# Patient Record
Sex: Female | Born: 1970 | Race: White | Hispanic: No | Marital: Married | Smoking: Heavy tobacco smoker
Health system: Southern US, Community
[De-identification: ages and names within clinical notes are randomized; demographics above are authoritative.]

## PROBLEM LIST (undated history)

## (undated) DIAGNOSIS — E063 Autoimmune thyroiditis: Secondary | ICD-10-CM

## (undated) DIAGNOSIS — E038 Other specified hypothyroidism: Secondary | ICD-10-CM

## (undated) DIAGNOSIS — E039 Hypothyroidism, unspecified: Secondary | ICD-10-CM

## (undated) HISTORY — PX: OTHER SURGICAL HISTORY: SHX169

## (undated) HISTORY — PX: WISDOM TOOTH EXTRACTION: SHX21

---

## 1998-12-26 HISTORY — PX: TUBAL LIGATION: SHX77

## 2016-07-05 ENCOUNTER — Ambulatory Visit (INDEPENDENT_AMBULATORY_CARE_PROVIDER_SITE_OTHER): Payer: Managed Care, Other (non HMO) | Admitting: Internal Medicine

## 2016-07-05 ENCOUNTER — Encounter: Payer: Self-pay | Admitting: Internal Medicine

## 2016-07-05 VITALS — BP 122/82 | HR 82 | Ht 67.0 in | Wt 212.0 lb

## 2016-07-05 DIAGNOSIS — E038 Other specified hypothyroidism: Secondary | ICD-10-CM

## 2016-07-05 DIAGNOSIS — E041 Nontoxic single thyroid nodule: Secondary | ICD-10-CM

## 2016-07-05 DIAGNOSIS — E063 Autoimmune thyroiditis: Secondary | ICD-10-CM

## 2016-07-05 DIAGNOSIS — E890 Postprocedural hypothyroidism: Secondary | ICD-10-CM | POA: Insufficient documentation

## 2016-07-05 LAB — TSH: TSH: 2.49 u[IU]/mL (ref 0.35–4.50)

## 2016-07-05 LAB — T3, FREE: T3 FREE: 2.5 pg/mL (ref 2.3–4.2)

## 2016-07-05 LAB — T4, FREE: FREE T4: 0.96 ng/dL (ref 0.60–1.60)

## 2016-07-05 MED ORDER — ARMOUR THYROID 60 MG PO TABS: 60.0000 mg | ORAL_TABLET | Freq: Every day | ORAL | Status: DC

## 2016-07-05 NOTE — Patient Instructions (Signed)
For now, continue Synthroid 100 mcg daily.  Take the thyroid hormone every day, with water, at least 30 minutes before breakfast, separated by at least 4 hours from: - acid reflux medications - calcium - iron - multivitamins  Please stop at the lab.  Please come back for a follow-up appointment in 3 months.

## 2016-07-05 NOTE — Progress Notes (Addendum)
Patient ID: Kimberly Pennington, female   DOB: 06/03/1971, 45 y.o.   MRN: [id number]   HPI  Kimberly Pennington is a 45 y.o.-year-old female, referred by her PCP, Dr. Donnajean Lopes, for management of Hashimoto's hypothyroidism and h/o thyroid nodules.  Pt. has been dx with hypothyroidism in ~2008 (she also had goiter) >> 2008: high thyroid Abx, TSH 112 >> started on Synthroid 100 mcg daily since 2015.  She had a thyroid U/S then >> L thyroid nodule (complex) >> Bx: benign. She had serial U/S's since then >> last U/S: 2014 - no change in size of the nodule.  She takes the thyroid hormone: - fasting at 6 am - with water - separated by >1h min from b'fast  - + calcium, multivitamins at 10 am - no iron - no PPIs  I reviewed pt's thyroid tests: 05/04/2016: TSH 2.04 2008: TSH 112   Pt describes: - + weight gain (+ 38 lbs in 2 years). She lost a lot of weight before on a high protein diet. - + fatigue - no cold intolerance - no depression - + constipation - no dry skin - hair loss  Pt denies feeling nodules in neck, dysphagia/odynophagia, SOB with lying down.  She has + FH of thyroid disorders in: sister. No FH of thyroid cancer.  No h/o radiation tx to head or neck. No recent use of iodine supplements.  ROS: Constitutional: See history of present illness Eyes: no blurry vision, no xerophthalmia ENT: no sore throat, no nodules palpated in throat, no dysphagia/odynophagia, + hoarseness Cardiovascular: no CP/SOB/palpitations/leg swelling Respiratory: no cough/SOB Gastrointestinal: no N/V/D/+ C Musculoskeletal: no muscle/+ joint aches Skin: no rashes, + itching Neurological: no tremors/numbness/tingling/dizziness Psychiatric: no depression/anxiety  PMH: - Hypothyroidism due to Hashimoto thyroiditis - Left thyroid nodule  Past Surgical History  Procedure Laterality Date  . Tubal ligation  2000    Social History   Social History  . Marital Status: Married    Spouse Name: N/A  .  Number of Children: 3   Occupational History  . Customer service rep    Social History Main Topics  . Smoking status: Yes, Vape  . Alcohol Use: Beer or wine seldom   . Drug Use: No   Current Outpatient Rx  Name  Route  Sig  Dispense  Refill  . Ergocalciferol (VITAMIN D2) 2000 units TABS   Oral   Take by mouth.         . Multiple Vitamin (MULTI-VITAMIN DAILY PO)   Oral   Take by mouth.         . SYNTHROID 100 MCG tablet      take 1 tablet by mouth daily      1     Dispense as written.    No Known Allergies   Family History  Problem Relation Age of Onset  . Heart disease Father   . Hyperlipidemia Father   . Hypertension Father   + see HPI  PE: BP 122/82 mmHg  Pulse 82  Ht 5\' 7"  (1.702 m)  Wt 212 lb (96.163 kg)  BMI 33.20 kg/m2  SpO2 97%  LMP 07/04/2016 Wt Readings from Last 3 Encounters:  07/05/16 212 lb (96.163 kg)   Constitutional: overweight, in NAD Eyes: PERRLA, EOMI, no exophthalmos ENT: moist mucous membranes, no thyromegaly, no cervical lymphadenopathy Cardiovascular: RRR, No MRG Respiratory: CTA B Gastrointestinal: abdomen soft, NT, ND, BS+ Musculoskeletal: no deformities, strength intact in all 4 Skin: moist, warm, no rashes Neurological: no tremor with outstretched  hands, DTR normal in all 4  ASSESSMENT: 1. Hypothyroidism 2/2 Hashimoto's thyroiditis  2. LThyroid nodule  PLAN:  1. Patient with long-standing hypothyroidism, on levothyroxine therapy. She complains of weight gain and fatigue on her current regimen of brand name Synthroid 100 g daily. Her most recent TSH was normal 2 months ago.  - she does not appear to have a goiter, thyroid nodules, or neck compression symptoms - We discussed about correct intake of levothyroxine, fasting, with water, separated by at least 30 minutes from breakfast, and separated by more than 4 hours from calcium, iron, multivitamins, acid reflux medications (PPIs). She is taking it correctly. - She is  interested in Armour thyroid as an alternative to her levothyroxine. Her sister has tried it and locked it. She is wondering whether this is a good fit for her. - We discussed about positive and negative aspects of using Armour thyroid. I underlined the fact that this is more expensive + Armour is purified from porcine thyroid glands, which is not without risk for contaminants  Also, the ratio between T4 and T3 in Armour is physiologic for pigs, not for humans.  The short half life of T3 can cause fluctuations in blood levels, which can result in mood swings and heart rhythm abnormalities.  The concentration of the active substances (T4 and T3) can be expected to vary between different Armour lots, which can cause variation in the thyroid function tests.  - She would like to go ahead and try it, so we decided to check thyroid tests today: TSH, free T4, free T3, TPO and ATA antibodies. I will then calculated the necessary dose of Armour based on the results. - If labs today are abnormal, she will need to return in 6  weeks for repeat labs - Otherwise, I will see her back in 4 months  2. L thyroid nodule  - no neck compression sxs - benign Bx - I plan to repeat an U/S next year - followed in the past with serial U/S's with no growth  Philemon Kingdom, MD PhD [facility] Endocrinology  Office Visit on 07/05/2016  Component Date Value Ref Range Status  . Free T4 07/05/2016 0.96  0.60 - 1.60 ng/dL Final  . T3, Free 07/05/2016 2.5  2.3 - 4.2 pg/mL Final  . TSH 07/05/2016 2.49  0.35 - 4.50 uIU/mL Final  . Thyroperoxidase Ab SerPl-aCnc 07/05/2016 368* <9 IU/mL Final  . Thyroglobulin Ab 07/05/2016 3* <2 IU/mL Final  Ab's are elevated, confirming Hashimoto's thyroiditis.  TFTs normal on 100 mcg daily of LT4 >> switch to Armour 60 mg daily. Will recheck TFTs in 5-6 weeks.

## 2016-07-06 ENCOUNTER — Telehealth: Payer: Self-pay

## 2016-07-06 LAB — THYROGLOBULIN ANTIBODY: THYROGLOBULIN AB: 3 [IU]/mL — AB (ref <2)

## 2016-07-06 LAB — THYROID PEROXIDASE ANTIBODY: THYROID PEROXIDASE ANTIBODY: 368 [IU]/mL — AB (ref <9)

## 2016-07-06 NOTE — Telephone Encounter (Signed)
Called and spoke with patient about lab results and medication that was sent to pharmacy. Patient had no questions or concerns.

## 2016-10-05 ENCOUNTER — Encounter: Payer: Self-pay | Admitting: Internal Medicine

## 2016-10-05 ENCOUNTER — Ambulatory Visit (INDEPENDENT_AMBULATORY_CARE_PROVIDER_SITE_OTHER): Payer: Managed Care, Other (non HMO) | Admitting: Internal Medicine

## 2016-10-05 VITALS — BP 104/72 | HR 73 | Temp 98.1°F | Resp 14 | Ht 67.0 in | Wt 209.4 lb

## 2016-10-05 DIAGNOSIS — E038 Other specified hypothyroidism: Secondary | ICD-10-CM

## 2016-10-05 DIAGNOSIS — E063 Autoimmune thyroiditis: Secondary | ICD-10-CM

## 2016-10-05 DIAGNOSIS — E041 Nontoxic single thyroid nodule: Secondary | ICD-10-CM

## 2016-10-05 LAB — TSH: TSH: 2.22 u[IU]/mL (ref 0.35–4.50)

## 2016-10-05 LAB — T4, FREE: Free T4: 0.49 ng/dL — ABNORMAL LOW (ref 0.60–1.60)

## 2016-10-05 LAB — T3, FREE: T3 FREE: 4.1 pg/mL (ref 2.3–4.2)

## 2016-10-05 MED ORDER — ARMOUR THYROID 60 MG PO TABS: 60.0000 mg | ORAL_TABLET | Freq: Every day | ORAL | 1 refills | Status: DC

## 2016-10-05 NOTE — Patient Instructions (Signed)
Please stop at the lab.  Please continue Armour 60 mg daily.  Take the thyroid hormone every day, with water, at least 30 minutes before breakfast, separated by at least 4 hours from: - acid reflux medications - calcium - iron - multivitamins  Please send me a message ~ 3-4 weeks before next appt in 6 months to order the new thyroid U/S.  Please come back for a follow-up appointment in 6 months

## 2016-10-05 NOTE — Progress Notes (Signed)
Patient ID: Kimberly Pennington, female   DOB: 1971-08-08, 45 y.o.   MRN: [id number]   HPI  Kimberly Pennington is a 45 y.o.-year-old female, initially referred by her PCP, Dr. Donnajean Lopes, returning for f/u 45 for Hashimoto's hypothyroidism and h/o thyroid nodules. Last visit 3 mo ago.  Pt. has been dx with hypothyroidism in ~2008 (she also had goiter) >> 2008: high thyroid Abx, TSH 112 >> started on Synthroid 100 mcg daily since 2015 >> at last visit, we changed to Armour 60 mg daily. She is still fatigued.   She had a thyroid U/S then >> L thyroid nodule (complex) >> Bx: benign. She had serial U/S's since then >> last U/S: 2014 - no change in size of the nodule.  She takes the thyroid hormone: - fasting at 6 am - with water - separated by >1h from b'fast  - + calcium, multivitamins at 10 am - no iron - no PPIs  I reviewed pt's thyroid tests: Office Visit on 07/05/2016  Component Date Value Ref Range Status  . Free T4 07/05/2016 0.96  0.60 - 1.60 ng/dL Final  . T3, Free 07/05/2016 2.5  2.3 - 4.2 pg/mL Final  . TSH 07/05/2016 2.49  0.35 - 4.50 uIU/mL Final  . Thyroperoxidase Ab SerPl-aCnc 07/06/2016 368* <9 IU/mL Final  . Thyroglobulin Ab 07/06/2016 3* <2 IU/mL Final   05/04/2016: TSH 2.04 2008: TSH 112   Pt describes: - no weight gain (+ 38 lbs in 2 years, then lost 3 lbs).  - + fatigue - no cold intolerance - no depression - + constipation (initialy improved) - no dry skin - no hair loss  Pt denies feeling nodules in neck, dysphagia/odynophagia, SOB with lying down.  She has + FH of thyroid disorders in: sister. No FH of thyroid cancer.  No h/o radiation tx to head or neck. No recent use of iodine supplements.  ROS: Constitutional: See history of present illness Eyes: no blurry vision, no xerophthalmia ENT: no sore throat, no nodules palpated in throat, no dysphagia/odynophagia, no hoarseness Cardiovascular: no CP/SOB/palpitations/leg swelling Respiratory: no  cough/SOB Gastrointestinal: no N/V/D/+ C Musculoskeletal: no muscle/joint aches Skin: no rashes, no itching Neurological: no tremors/numbness/tingling/dizziness  I reviewed pt's medications, allergies, PMH, social hx, family hx, and changes were documented in the history of present illness. Otherwise, unchanged from my initial visit note.  PMH: - Hypothyroidism due to Hashimoto thyroiditis - Left thyroid nodule  Past Surgical History:  Procedure Laterality Date  . TUBAL LIGATION  2000    Social History   Social History  . Marital Status: Married    Spouse Name: N/A  . Number of Children: 3   Occupational History  . Customer service rep    Social History Main Topics  . Smoking status: Yes, Vape  . Alcohol Use: Beer or wine seldom   . Drug Use: No   Current Outpatient Rx  Name  Route  Sig  Dispense  Refill  . Ergocalciferol (VITAMIN D2) 2000 units TABS   Oral   Take by mouth.         . Multiple Vitamin (MULTI-VITAMIN DAILY PO)   Oral   Take by mouth.         . SYNTHROID 100 MCG tablet      take 1 tablet by mouth daily      1     Dispense as written.    No Known Allergies   Family History  Problem Relation Age of Onset  . Heart disease  Father   . Hyperlipidemia Father   . Hypertension Father   + see HPI  PE: BP 104/72   Pulse 73   Temp 98.1 F (36.7 C)   Resp 14   Ht 5\' 7"  (1.702 m)   Wt 209 lb 6.4 oz (95 kg)   SpO2 97%   BMI 32.80 kg/m  Wt Readings from Last 3 Encounters:  10/05/16 209 lb 6.4 oz (95 kg)  07/05/16 212 lb (96.2 kg)   Constitutional: overweight, in NAD Eyes: PERRLA, EOMI, no exophthalmos ENT: moist mucous membranes, no thyromegaly, no cervical lymphadenopathy Cardiovascular: RRR, No MRG Respiratory: CTA B Gastrointestinal: abdomen soft, NT, ND, BS+ Musculoskeletal: no deformities, strength intact in all 4 Skin: moist, warm, no rashes Neurological: no tremor with outstretched hands, DTR normal in all 4  ASSESSMENT: 1.  Hypothyroidism 2/2 Hashimoto's thyroiditis  2. LThyroid nodule  PLAN:  1. Patient with long-standing hypothyroidism, now on on Armour therapy (had weight gain and constipation with LT4). She was interested in Armour thyroid as an alternative to her levothyroxine. Her sister has tried it and locked it. We started this at last visit >> 60 mg daily. She initially felt better, but now still fatigued. - We discussed about correct intake of thyroid hh, fasting, with water, separated by at least 30 minutes from breakfast, and separated by more than 4 hours from calcium, iron, multivitamins, acid reflux medications (PPIs). She is taking it correctly. - will check thyroid tests today: TSH, free T4, free T3 - If labs today are abnormal, she will need to return in 6  weeks for repeat labs - Otherwise, I will see her back in 6 months  2. L thyroid nodule  - no neck compression sxs - benign Bx - I plan to repeat an U/S next year - followed in the past with serial U/S's with no growth  Component     Latest Ref Rng & Units 10/05/2016  T4,Free(Direct)     0.60 - 1.60 ng/dL 0.49 (L)  Triiodothyronine,Free,Serum     2.3 - 4.2 pg/mL 4.1  TSH     0.35 - 4.50 uIU/mL 2.22   Free T3 is at the upper limit of normal, while the TSH is normal. Free T4 is slightly low. For now, I would suggest to continue the current dose of Armour.  Philemon Kingdom, MD PhD [facility] Endocrinology

## 2016-10-06 ENCOUNTER — Other Ambulatory Visit: Payer: Self-pay

## 2016-10-06 DIAGNOSIS — E038 Other specified hypothyroidism: Secondary | ICD-10-CM

## 2016-10-06 DIAGNOSIS — E063 Autoimmune thyroiditis: Principal | ICD-10-CM

## 2016-10-06 MED ORDER — THYROID 90 MG PO TABS: ORAL_TABLET | ORAL | 1 refills | Status: DC

## 2017-04-06 ENCOUNTER — Ambulatory Visit: Payer: Managed Care, Other (non HMO) | Admitting: Internal Medicine

## 2017-04-21 ENCOUNTER — Encounter: Payer: Self-pay | Admitting: Internal Medicine

## 2017-04-21 ENCOUNTER — Ambulatory Visit (INDEPENDENT_AMBULATORY_CARE_PROVIDER_SITE_OTHER): Payer: Managed Care, Other (non HMO) | Admitting: Internal Medicine

## 2017-04-21 VITALS — BP 114/82 | HR 82 | Resp 16 | Ht 68.0 in | Wt 216.0 lb

## 2017-04-21 DIAGNOSIS — E063 Autoimmune thyroiditis: Secondary | ICD-10-CM | POA: Diagnosis not present

## 2017-04-21 DIAGNOSIS — E038 Other specified hypothyroidism: Secondary | ICD-10-CM

## 2017-04-21 DIAGNOSIS — E041 Nontoxic single thyroid nodule: Secondary | ICD-10-CM | POA: Diagnosis not present

## 2017-04-21 LAB — T4, FREE: Free T4: 0.61 ng/dL (ref 0.60–1.60)

## 2017-04-21 LAB — TSH: TSH: 0.66 u[IU]/mL (ref 0.35–4.50)

## 2017-04-21 MED ORDER — THYROID 90 MG PO TABS: ORAL_TABLET | ORAL | 1 refills | Status: DC

## 2017-04-21 NOTE — Progress Notes (Addendum)
Patient ID: Kimberly Pennington, female   DOB: 11-28-1971, 46 y.o.   MRN: [id number]   HPI  Kimberly Pennington is a 46 y.o.-year-old female, initially referred by her PCP, Dr. Donnajean Lopes, returning for f/u for Hashimoto's hypothyroidism and h/o thyroid nodules. Last visit 6 mo ago.  Reviewed hx: Pt. has been dx with hypothyroidism in ~2008 (she also had goiter) >> 2008: high thyroid Abx, TSH 112 >> started on Synthroid 100 mcg daily since 2015 >> we then changed to Armour 60 mg daily.   She had a thyroid U/S then >> R thyroid nodule (complex) - 2 cm >> Bx: benign. She had serial U/S's since then >> last U/S: 2014 - no change in size of the nodule.  Pt is on Armour 60 alternating with 90 mg daily, taken: - in am (6 am) - fasting - at least 30 min from b'fast - no Fe, PPIs - ran out calcium, multivitamins  - not on Biotin  I reviewed pt's thyroid tests: Lab Results  Component Value Date   TSH 2.22 10/05/2016   TSH 2.49 07/05/2016   FREET4 0.49 (L) 10/05/2016   FREET4 0.96 07/05/2016   T3FREE 4.1 10/05/2016   T3FREE 2.5 07/05/2016  05/04/2016: TSH 2.04 2008: TSH 112   Office Visit on 07/05/2016  Component Date Value Ref Range Status  . Thyroperoxidase Ab SerPl-aCnc 07/06/2016 368* <9 IU/mL Final  . Thyroglobulin Ab 07/06/2016 3* <2 IU/mL Final   Pt describes: - no weight gain - + fatigue - no cold intolerance - no depression - no constipation - no dry skin - no hair loss  Pt denies feeling nodules in neck, dysphagia/odynophagia, SOB with lying down.  She has + FH of thyroid disorders in: sister. No FH of thyroid cancer.  No h/o radiation tx to head or neck. No recent use of iodine supplements.  ROS: Constitutional: no weight gain/no weight loss, no fatigue, no subjective hyperthermia, no subjective hypothermia Eyes: no blurry vision, no xerophthalmia ENT: no sore throat, no nodules palpated in throat, no dysphagia, no odynophagia, no hoarseness Cardiovascular: no CP/no SOB/no  palpitations/no leg swelling Respiratory: no cough/no SOB/no wheezing Gastrointestinal: no N/no V/no D/no C/no acid reflux Musculoskeletal: no muscle aches/no joint aches Skin: no rashes, no hair loss Neurological: no tremors/no numbness/no tingling/no dizziness  I reviewed pt's medications, allergies, PMH, social hx, family hx, and changes were documented in the history of present illness. Otherwise, unchanged from my initial visit note.  PMH: - Hypothyroidism due to Hashimoto thyroiditis - Left thyroid nodule  Past Surgical History:  Procedure Laterality Date  . TUBAL LIGATION  2000    Social History   Social History  . Marital Status: Married    Spouse Name: N/A  . Number of Children: 3   Occupational History  . Customer service rep    Social History Main Topics  . Smoking status: Yes, Vape  . Alcohol Use: Beer or wine seldom   . Drug Use: No   Current Outpatient Rx  Name  Route  Sig  Dispense  Refill  . Ergocalciferol (VITAMIN D2) 2000 units TABS   Oral   Take by mouth.         . Multiple Vitamin (MULTI-VITAMIN DAILY PO)   Oral   Take by mouth.         . SYNTHROID 100 MCG tablet      take 1 tablet by mouth daily      1     Dispense as written.  No Known Allergies   Family History  Problem Relation Age of Onset  . Heart disease Father   . Hyperlipidemia Father   . Hypertension Father   + see HPI  PE: BP 114/82   Pulse 82   Resp 16   Ht 5\' 8"  (1.727 m)   Wt 216 lb (98 kg)   SpO2 96%   BMI 32.84 kg/m  Wt Readings from Last 3 Encounters:  04/21/17 216 lb (98 kg)  10/05/16 209 lb 6.4 oz (95 kg)  07/05/16 212 lb (96.2 kg)   Constitutional: obese, in NAD Eyes: PERRLA, EOMI, no exophthalmos ENT: moist mucous membranes, + mild thyroid fullness, no cervical lymphadenopathy Cardiovascular: RRR, No MRG Respiratory: CTA B Gastrointestinal: abdomen soft, NT, ND, BS+ Musculoskeletal: no deformities, strength intact in all 4 Skin: moist,  warm, no rashes Neurological: no tremor with outstretched hands, DTR normal in all 4  ASSESSMENT: 1. Hypothyroidism 2/2 Hashimoto's thyroiditis  2. R Thyroid nodule  PLAN:  1. Patient with long-standing hypothyroidism, on Arrmour thyroid  (had weight gain and constipation with LT4). She still has some fatigue, but is overall feeling well on the current regimen: 60 alternating with 90 mg qod. - We discussed about correct intake of thyroid: every day, with water, at least 30 minutes before breakfast, separated by at least 4 hours from: - acid reflux medications - calcium - iron - multivitamins She is taking it correctly. - will check thyroid tests today:  Orders Placed This Encounter  . T4, free  . TSH   - If labs today are abnormal, she will need to return in 6 weeks for repeat labs - Otherwise, I will see her back in 1 year  2. R thyroid nodule  - no neck compression sxs - previous Bx benign - will check a new U/S now, 4 years from prior - followed with serial U/S in the past >> stable  Component     Latest Ref Rng & Units 04/21/2017  T4,Free(Direct)     0.60 - 1.60 ng/dL 0.61  TSH     0.35 - 4.50 uIU/mL 0.66   TFTs normal.  US SOFT TISSUE HEAD AND NECK  Order: [id number]  Status:  Final result Visible to patient:  No (Not Released) Dx:  Left thyroid nodule  Details   Reading Physician Reading Date Result Priority  Arne Cleveland, MD 04/28/2017   Narrative    CLINICAL DATA: Left nodule on physical exam  EXAM: THYROID ULTRASOUND  TECHNIQUE: Ultrasound examination of the thyroid gland and adjacent soft tissues was performed.  COMPARISON: None.  FINDINGS: Parenchymal Echotexture: Mildly heterogenous  Isthmus: 0.3 cm thickness  Right lobe: 3.7 x 1 x 1.4 cm  Left lobe: 3.1 x 0.7 x 1 cm  _________________________________________________________  Estimated total number of nodules >/= 1 cm: 1  Number of spongiform nodules >/= 2 cm not described below  (TR1): 0  Number of mixed cystic and solid nodules >/= 1.5 cm not described below (TR2): 0  _________________________________________________________  Nodule # 1:  Location: Right; Mid  Maximum size: 1.5 cm; Other 2 dimensions: 0.8 x 1 cm  Composition: solid/almost completely solid (2)  Echogenicity: isoechoic (1)  Shape: not taller-than-wide (0)  Margins: smooth (0)  Echogenic foci: peripheral calcifications (2)  ACR TI-RADS total points: 5.  ACR TI-RADS risk category: TR4 (4-6 points).  ACR TI-RADS recommendations:  **Given size (>/= 1.5 cm) and appearance, fine needle aspiration of this moderately suspicious nodule should be considered based on  TI-RADS criteria.  _________________________________________________________  0.7 x 0.5 cm hypoechoic nodule without calcifications, inferior left.  IMPRESSION: 1. Small heterogeneous thyroid with bilateral nodules. 2. Recommend FNA biopsy of 1.5 cm mid right moderately suspicious nodule.  The above is in keeping with the ACR TI-RADS recommendations - J Am Coll Radiol 2017;14:587-595.   Electronically Signed By: Lucrezia Europe M.D. On: 04/28/2017 14:24       Size of the nodule >> smaller than before, but I do not have access to previous images. Since the nodule does appear slightly worrisome, I would suggest to have another biopsy. If this is benign, no further biopsies are necessary.  Adequacy Reason Satisfactory For Evaluation. Diagnosis THYROID, FINE NEEDLE ASPIRATION, RIGHT RMP (SPECIMEN 1 OF 1, COLLECTED 6/72/09): - FOLLICULAR NEOPLASM OR SUSPICIOUS FOR A FOLLICULAR NEOPLASM (B[place]ESDA CATEGORY IV) - SEE COMMENT DAWN BUTLER MD Pathologist, Electronic Signature (Case signed 05/11/2017) Specimen Clinical Information Nodule 1 Right Mid 1.5 cm; Other 2 dimensions 0.8 x 1cm solid / almost completely solid, Isoechoic, ACR TI-RADS total points: 5, Moderatley suspicious nodule Source Thyroid, Fine Needle  Aspiration, Rt RMP, (Specimen 1 of 1, collected on 05/10/17 )  Risk of malignancy is 15-30% for this results (per Bethesda classification) >> recommendation is for lobectomy.  Philemon Kingdom, MD PhD [facility] Endocrinology

## 2017-04-21 NOTE — Patient Instructions (Addendum)
Please stop at the lab.  Please continue Armour 60 mg alternating with 90 mg every other day.  Take the thyroid hormone every day, with water, at least 30 minutes before breakfast, separated by at least 4 hours from: - acid reflux medications - calcium - iron - multivitamins  You will be called with the schedule for the thyroid U/S.  Please come back for a follow-up appointment in 1 year.

## 2017-04-28 ENCOUNTER — Ambulatory Visit
Admission: RE | Admit: 2017-04-28 | Discharge: 2017-04-28 | Disposition: A | Discharge status: Home / Self Care | Payer: Managed Care, Other (non HMO) | Source: Ambulatory Visit | Attending: Internal Medicine | Admitting: Internal Medicine

## 2017-05-01 ENCOUNTER — Telehealth: Payer: Self-pay | Admitting: Internal Medicine

## 2017-05-01 ENCOUNTER — Other Ambulatory Visit: Payer: Self-pay | Admitting: Internal Medicine

## 2017-05-01 DIAGNOSIS — E041 Nontoxic single thyroid nodule: Secondary | ICD-10-CM

## 2017-05-01 NOTE — Telephone Encounter (Signed)
Done

## 2017-05-01 NOTE — Telephone Encounter (Signed)
Pt called in and wanted to let Dr. Cruzita Lederer know that she would like to go ahead and do the Bx of her Thyroid.

## 2017-05-01 NOTE — Telephone Encounter (Signed)
Wanted you to be aware.  Thank you!   

## 2017-05-10 ENCOUNTER — Other Ambulatory Visit (HOSPITAL_COMMUNITY)
Admission: RE | Admit: 2017-05-10 | Discharge: 2017-05-10 | Disposition: A | Discharge status: Home / Self Care | Payer: Managed Care, Other (non HMO) | Source: Ambulatory Visit | Attending: Radiology | Admitting: Radiology

## 2017-05-10 ENCOUNTER — Ambulatory Visit
Admission: RE | Admit: 2017-05-10 | Discharge: 2017-05-10 | Disposition: A | Discharge status: Home / Self Care | Payer: Managed Care, Other (non HMO) | Source: Ambulatory Visit | Attending: Internal Medicine | Admitting: Internal Medicine

## 2017-05-10 DIAGNOSIS — E041 Nontoxic single thyroid nodule: Secondary | ICD-10-CM | POA: Diagnosis present

## 2017-05-12 ENCOUNTER — Telehealth: Payer: Self-pay | Admitting: Internal Medicine

## 2017-05-12 ENCOUNTER — Other Ambulatory Visit: Payer: Self-pay | Admitting: Internal Medicine

## 2017-05-12 DIAGNOSIS — E041 Nontoxic single thyroid nodule: Secondary | ICD-10-CM

## 2017-05-12 NOTE — Telephone Encounter (Signed)
Please advise. Thank you

## 2017-05-12 NOTE — Telephone Encounter (Signed)
Pt is returning a call regarding her biopsy

## 2017-05-12 NOTE — Telephone Encounter (Signed)
Called back >> no answer. I will retry later.

## 2017-05-24 ENCOUNTER — Ambulatory Visit: Payer: Self-pay | Admitting: Surgery

## 2017-06-13 ENCOUNTER — Ambulatory Visit (HOSPITAL_COMMUNITY)
Admission: RE | Admit: 2017-06-13 | Discharge: 2017-06-13 | Disposition: A | Discharge status: Home / Self Care | Payer: Managed Care, Other (non HMO) | Source: Ambulatory Visit | Attending: Surgery | Admitting: Surgery

## 2017-06-13 ENCOUNTER — Encounter (HOSPITAL_COMMUNITY)
Admission: RE | Admit: 2017-06-13 | Discharge: 2017-06-13 | Disposition: A | Discharge status: Home / Self Care | Payer: Managed Care, Other (non HMO) | Source: Ambulatory Visit | Attending: Surgery | Admitting: Surgery

## 2017-06-13 ENCOUNTER — Encounter (HOSPITAL_COMMUNITY): Payer: Self-pay

## 2017-06-13 DIAGNOSIS — Z01818 Encounter for other preprocedural examination: Secondary | ICD-10-CM

## 2017-06-13 DIAGNOSIS — D440 Neoplasm of uncertain behavior of thyroid gland: Secondary | ICD-10-CM | POA: Diagnosis not present

## 2017-06-13 HISTORY — DX: Autoimmune thyroiditis: E03.8

## 2017-06-13 HISTORY — DX: Hypothyroidism, unspecified: E03.9

## 2017-06-13 HISTORY — DX: Autoimmune thyroiditis: E06.3

## 2017-06-13 LAB — BASIC METABOLIC PANEL
Anion gap: 7 (ref 5–15)
BUN: 7 mg/dL (ref 6–20)
CHLORIDE: 104 mmol/L (ref 101–111)
CO2: 25 mmol/L (ref 22–32)
Calcium: 9.2 mg/dL (ref 8.9–10.3)
Creatinine, Ser: 0.92 mg/dL (ref 0.44–1.00)
GFR calc non Af Amer: >60 mL/min (ref >60)
Glucose, Bld: 99 mg/dL (ref 65–99)
POTASSIUM: 3.7 mmol/L (ref 3.5–5.1)
SODIUM: 136 mmol/L (ref 135–145)

## 2017-06-13 LAB — CBC
HEMATOCRIT: 37.2 % (ref 36.0–46.0)
Hemoglobin: 12.8 g/dL (ref 12.0–15.0)
MCH: 29.2 pg (ref 26.0–34.0)
MCHC: 34.4 g/dL (ref 30.0–36.0)
MCV: 84.9 fL (ref 78.0–100.0)
Platelets: 230 10*3/uL (ref 150–400)
RBC: 4.38 MIL/uL (ref 3.87–5.11)
RDW: 12.4 % (ref 11.5–15.5)
WBC: 7.4 10*3/uL (ref 4.0–10.5)

## 2017-06-13 LAB — HCG, SERUM, QUALITATIVE: Preg, Serum: NEGATIVE

## 2017-06-13 IMAGING — US US SOFT TISSUE HEAD/NECK
1 series · 13 of 25 positions shown · non-contrast
Comparison: None.

CLINICAL DATA: Left nodule on physical exam

EXAM:
THYROID ULTRASOUND
TECHNIQUE: Ultrasound examination of the thyroid gland and adjacent soft
tissues was performed.

[Series 1: us soft tissue head/neck · 0.04mm/px · 13 of 42 slices shown]
[im 1/42]
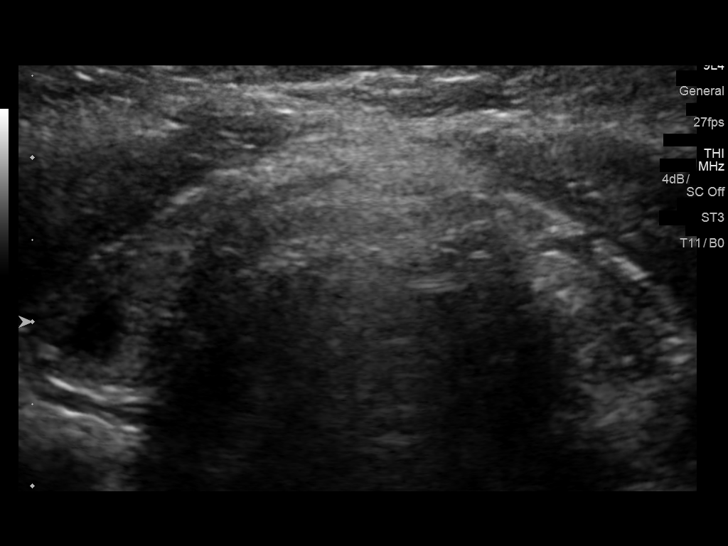
[im 4/42]
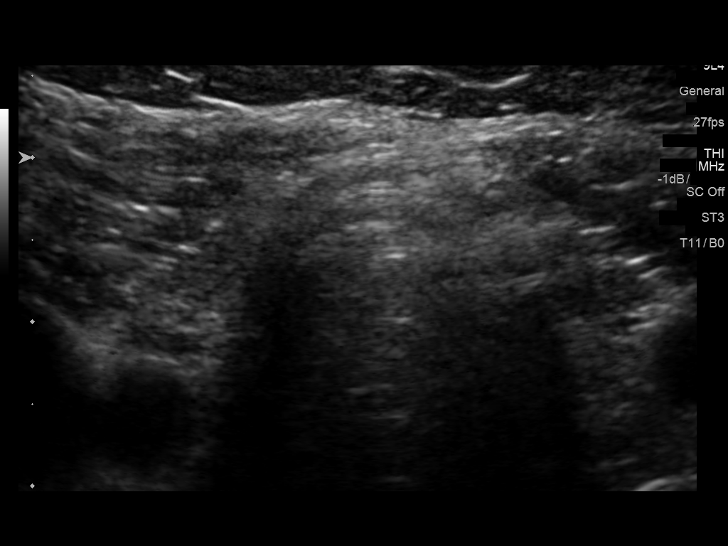
[im 7/42]
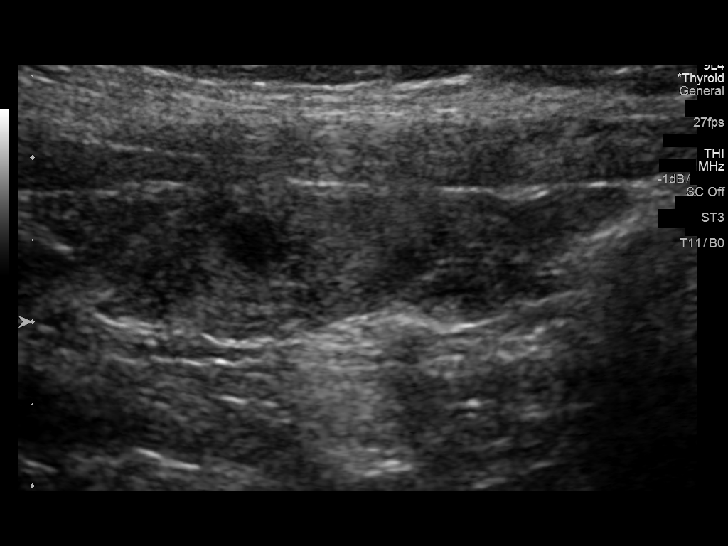
[im 11/42]
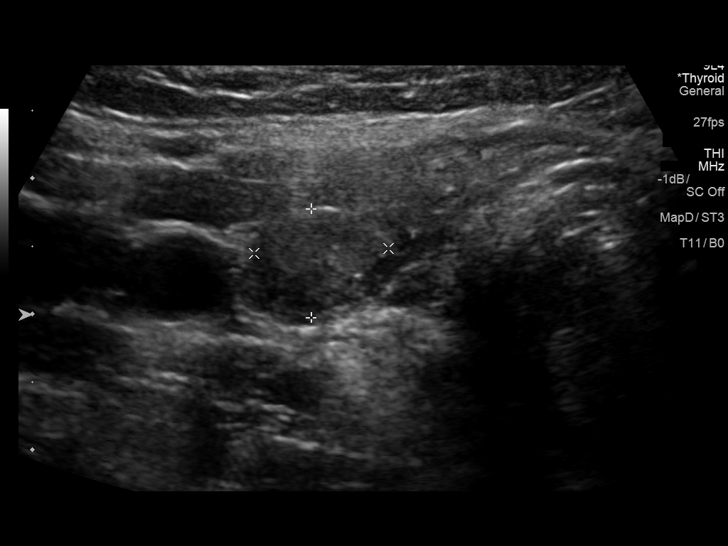
[im 14/42]
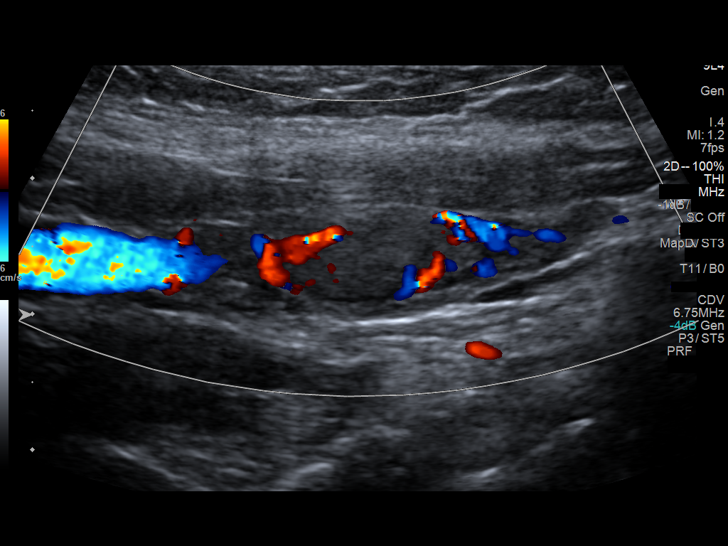
[im 18/42]
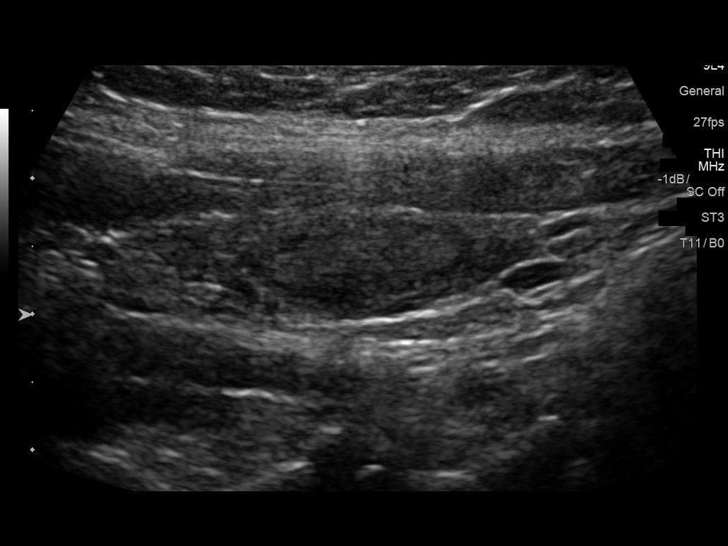
[im 21/42]
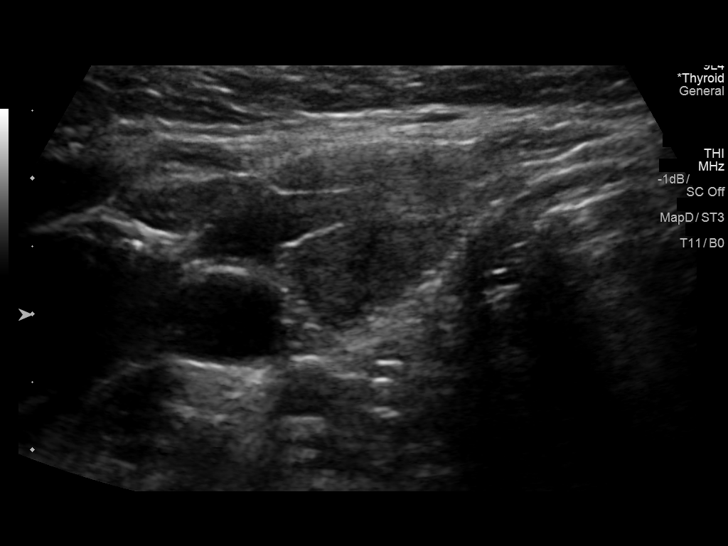
[im 24/42]
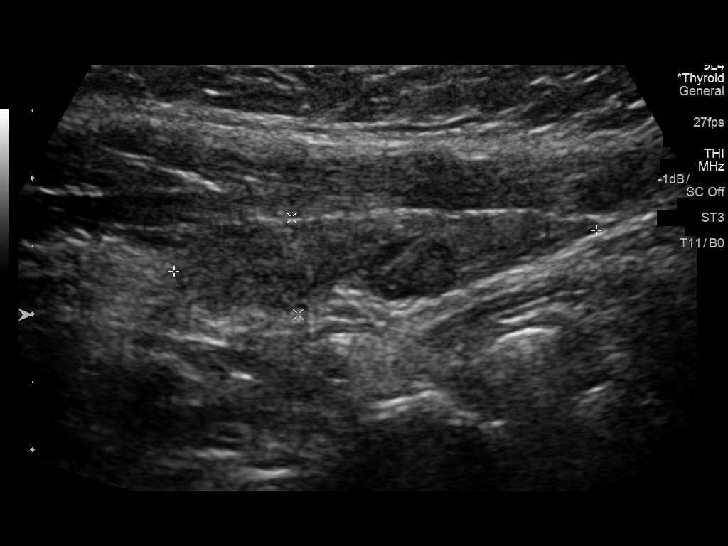
[im 28/42]
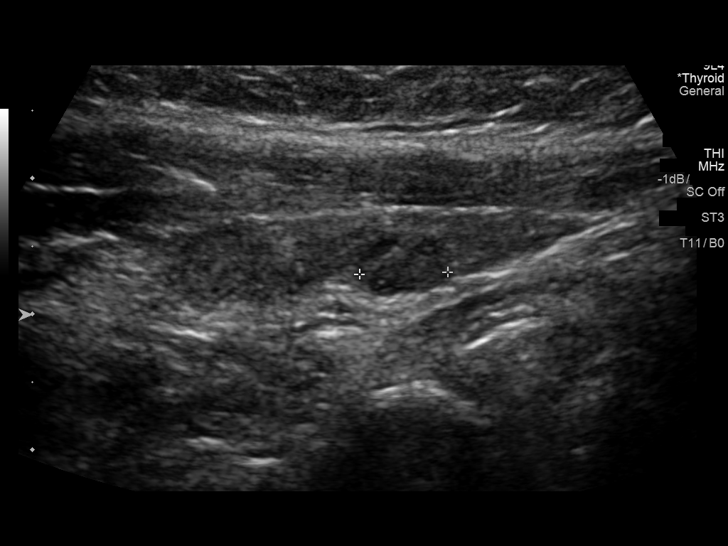
[im 31/42]
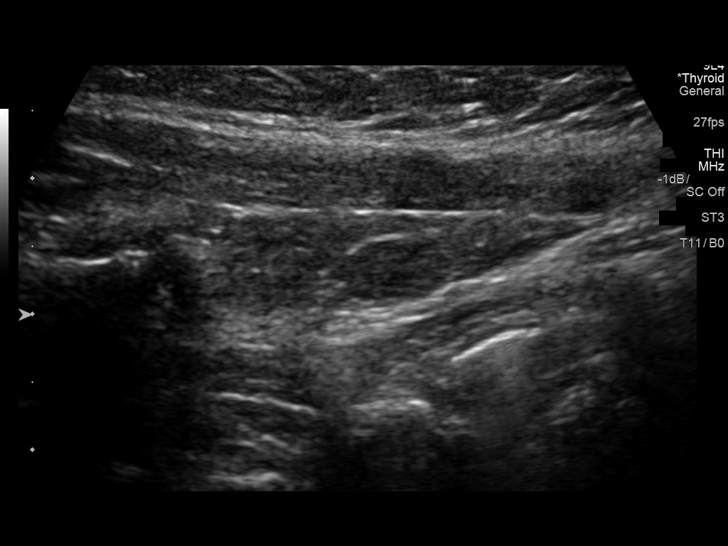
[im 35/42]
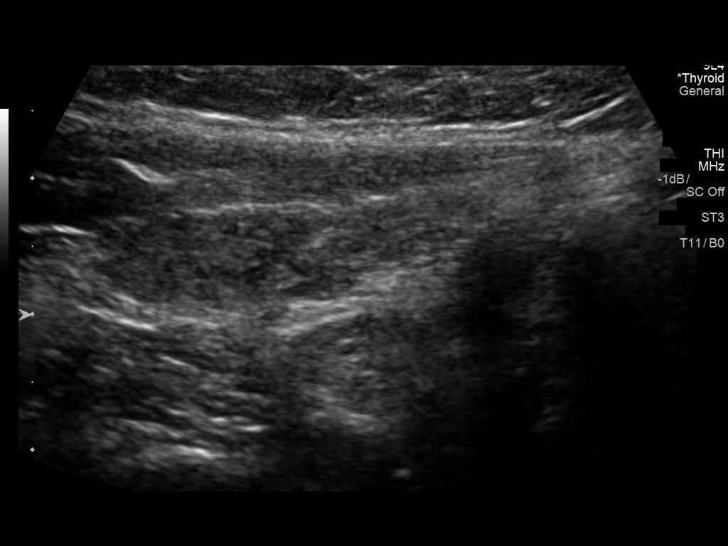
[im 38/42]
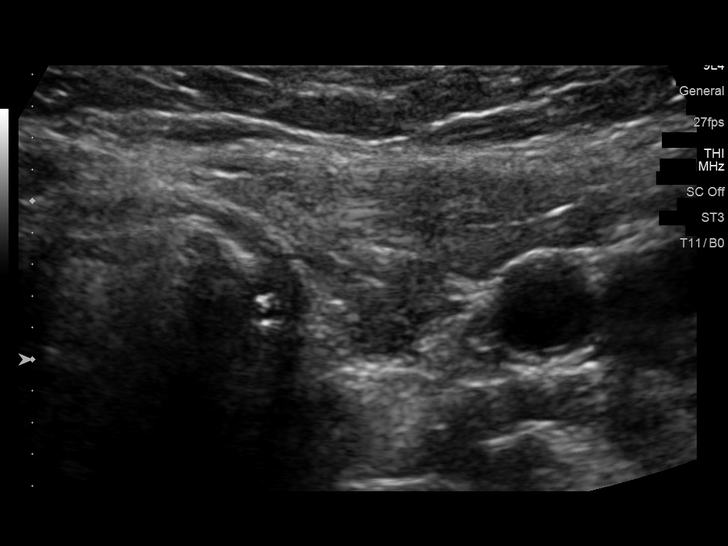
[im 42/42]
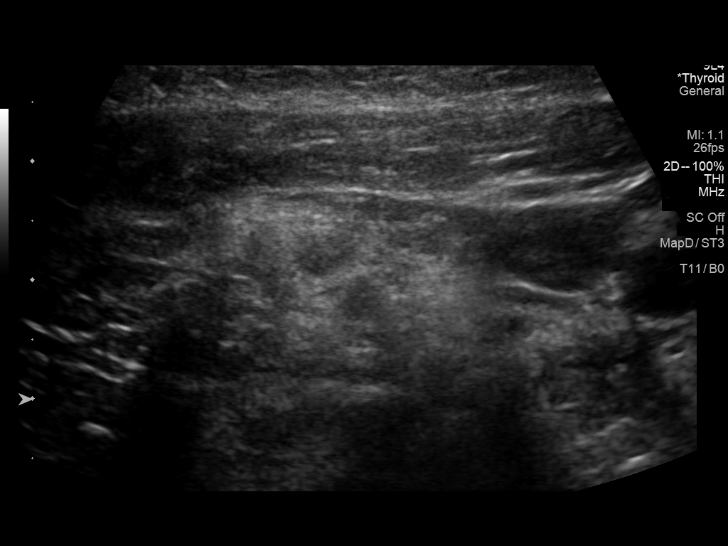

[13 of 25 positions shown; findings below may reference images not displayed]

FINDINGS: Parenchymal Echotexture: Mildly heterogenous

Isthmus: 0.3 cm thickness

Right lobe: 3.7 x 1 x 1.4 cm

Left lobe: 3.1 x 0.7 x 1 cm

_________________________________________________________

Estimated total number of nodules >/= 1 cm: 1

Number of spongiform nodules >/=  2 cm not described below (TR1): 0

Number of mixed cystic and solid nodules >/= 1.5 cm not described
below (TR2): 0

_________________________________________________________

Nodule # 1:

Location: Right; Mid

Maximum size: 1.5 cm; Other 2 dimensions: 0.8 x 1 cm

Composition: solid/almost completely solid (2)

Echogenicity: isoechoic (1)

Shape: not taller-than-wide (0)

Margins: smooth (0)

Echogenic foci: peripheral calcifications (2)

ACR TI-RADS total points: 5.

ACR TI-RADS risk category: TR4 (4-6 points).

ACR TI-RADS recommendations:

**Given size (>/= 1.5 cm) and appearance, fine needle aspiration of
this moderately suspicious nodule should be considered based on
TI-RADS criteria.

_________________________________________________________

0.7 x 0.5 cm hypoechoic nodule without calcifications, inferior
left.
IMPRESSION: 1. Small heterogeneous thyroid with bilateral nodules.
2. Recommend FNA biopsy of 1.5 cm mid right moderately suspicious
nodule.

The above is in keeping with the ACR TI-RADS recommendations - [HOSPITAL] 4162;[DATE].

## 2017-06-13 NOTE — Pre-Procedure Instructions (Signed)
Kimberly Pennington  06/13/2017      [facility] [address] [place], [place] - [address] [place] [address] Phone: [phone] Fax: [phone]    Your procedure is scheduled on 06/19/2017  Report to [facility] Admitting at 5:30 A.M.  Call this number if you have problems the morning of surgery:  [phone]   Remember:  Do not eat food or drink liquids after midnight.  On Sunday night    Take these medicines the morning of surgery with A SIP OF WATER : Thyroid medicine    Do not wear jewelry, make-up or nail polish.   Do not wear lotions, powders, or perfumes, or deoderant.   Do not shave 48 hours prior to surgery.     Do not bring valuables to the hospital.   [facility] is not responsible for any belongings or valuables.  Contacts, dentures or bridgework may not be worn into surgery.  Leave your suitcase in the car.  After surgery it may be brought to your room.  For patients admitted to the hospital, discharge time will be determined by your treatment team.  Patients discharged the day of surgery will not be allowed to drive home.   Name and phone number of your driver:   With spouse  Special instructions:  Special Instructions: [employer] - Preparing for Surgery  Before surgery, you can play an important role.  Because skin is not sterile, your skin needs to be as free of germs as possible.  You can reduce the number of germs on you skin by washing with CHG (chlorahexidine gluconate) soap before surgery.  CHG is an antiseptic cleaner which kills germs and bonds with the skin to continue killing germs even after washing.  Please DO NOT use if you have an allergy to CHG or antibacterial soaps.  If your skin becomes reddened/irritated stop using the CHG and inform your nurse when you arrive at Short Stay.  Do not shave (including legs and underarms) for at least 48 hours prior to the first  CHG shower.  You may shave your face.  Please follow these instructions carefully:   1.  Shower with CHG Soap the night before surgery and the  morning of Surgery.  2.  If you choose to wash your hair, wash your hair first as usual with your  normal shampoo.  3.  After you shampoo, rinse your hair and body thoroughly to remove the  Shampoo.  4.  Use CHG as you would any other liquid soap.  You can apply chg directly to the skin and wash gently with scrungie or a clean washcloth.  5.  Apply the CHG Soap to your body ONLY FROM THE NECK DOWN.    Do not use on open wounds or open sores.  Avoid contact with your eyes, ears, mouth and genitals (private parts).  Wash genitals (private parts)   with your normal soap.  6.  Wash thoroughly, paying special attention to the area where your surgery will be performed.  7.  Thoroughly rinse your body with warm water from the neck down.  8.  DO NOT shower/wash with your normal soap after using and rinsing off   the CHG Soap.  9.  Pat yourself dry with a clean towel.            10 .  Wear clean pajamas.  11.  Place clean sheets on your bed the night of your first shower and do not sleep with pets.  Day of Surgery  Do not apply any lotions/deodorants the morning of surgery.  Please wear clean clothes to the hospital/surgery center.  Please read over the following fact sheets that you were given. Pain Booklet, Coughing and Deep Breathing and Surgical Site Infection Prevention

## 2017-06-17 MED ORDER — CEFAZOLIN SODIUM-DEXTROSE 2-4 GM/100ML-% IV SOLN
2.0000 g | INTRAVENOUS | Status: AC
  Administered 2017-06-19: 2 g via INTRAVENOUS
  Filled 2017-06-17: qty 100

## 2017-06-18 ENCOUNTER — Encounter (HOSPITAL_COMMUNITY): Payer: Self-pay | Admitting: Surgery

## 2017-06-18 DIAGNOSIS — D440 Neoplasm of uncertain behavior of thyroid gland: Secondary | ICD-10-CM | POA: Diagnosis present

## 2017-06-18 NOTE — H&P (Signed)
General Surgery [facility]  Kimberly Pennington DOB: 1971-02-27 Married / Language: English / Race: White Female  History of Present Illness  The patient is a 46 year old female who presents with a thyroid nodule. CC: thyroid nodule with atypia, Hashimoto's thyroiditis  Patient is referred by Dr. Philemon Kingdom for evaluation of thyroid nodule with cytologic atypia and Hashimoto's thyroiditis. Patient has approximately a 10 year history of thyroid problems. She was diagnosed with Hashimoto's thyroiditis. She has been treated with thyroid medication for approximately 10 years. She is currently taking Armour Thyroid 60 mg daily. Most recent TSH level is normal at 0.66. Patient has a history of biopsy of the thyroid at the time of a thyroid cyst. This was benign. She has been followed with ultrasound exams. Patient had an ultrasound in May 2018 which demonstrated a small thyroid gland with the right lobe measuring 3.7 cm and left lobe measuring 3.1 cm. In the right lobe was a dominant nodule measuring 1.5 cm and biopsy was recommended. Cytopathology shows a follicular neoplasm, Bethesda category IV. Patient is now referred for surgical evaluation for definitive diagnosis and management. Patient has no history of compressive symptoms. She denies tremors or palpitations. She has had no prior head or neck surgery. There is no family history of thyroid disease or other endocrine neoplasms. She is accompanied today by her husband.   Diagnostic Studies History Colonoscopy  never Mammogram  within last year Pap Smear  1-5 years ago  Allergies No Known Drug Allergies 05/24/2017 Allergies Reconciled   Medication History Armour Thyroid (60MG  Tablet, Oral) Active. Medications Reconciled  Social History Alcohol use  Occasional alcohol use. Caffeine use  Carbonated beverages. No drug use  Tobacco use  Current every day smoker.  Family History Breast  Cancer  Sister. Heart Disease  Father. Heart disease in female family member before age 15  Respiratory Condition  Mother.  Pregnancy / Birth History Age at menarche  81 years. Contraceptive History  Contraceptive implant. Gravida  4 Length (months) of breastfeeding  3-6 Maternal age  93-20 Para  3 Regular periods   Other Problems  Thyroid Disease   Review of Systems  General Not Present- Appetite Loss, Chills, Fatigue, Fever, Night Sweats, Weight Gain and Weight Loss. Skin Not Present- Change in Wart/Mole, Dryness, Hives, Jaundice, New Lesions, Non-Healing Wounds, Rash and Ulcer. HEENT Not Present- Earache, Hearing Loss, Hoarseness, Nose Bleed, Oral Ulcers, Ringing in the Ears, Seasonal Allergies, Sinus Pain, Sore Throat, Visual Disturbances, Wears glasses/contact lenses and Yellow Eyes. Respiratory Not Present- Bloody sputum, Chronic Cough, Difficulty Breathing, Snoring and Wheezing. Breast Not Present- Breast Mass, Breast Pain, Nipple Discharge and Skin Changes. Cardiovascular Not Present- Chest Pain, Difficulty Breathing Lying Down, Leg Cramps, Palpitations, Rapid Heart Rate, Shortness of Breath and Swelling of Extremities. Gastrointestinal Not Present- Abdominal Pain, Bloating, Bloody Stool, Change in Bowel Habits, Chronic diarrhea, Constipation, Difficulty Swallowing, Excessive gas, Gets full quickly at meals, Hemorrhoids, Indigestion, Nausea, Rectal Pain and Vomiting. Female Genitourinary Not Present- Frequency, Nocturia, Painful Urination, Pelvic Pain and Urgency. Musculoskeletal Not Present- Back Pain, Joint Pain, Joint Stiffness, Muscle Pain, Muscle Weakness and Swelling of Extremities. Neurological Not Present- Decreased Memory, Fainting, Headaches, Numbness, Seizures, Tingling, Tremor, Trouble walking and Weakness. Psychiatric Not Present- Anxiety, Bipolar, Change in Sleep Pattern, Depression, Fearful and Frequent crying. Endocrine Not Present- Cold Intolerance,  Excessive Hunger, Hair Changes, Heat Intolerance, Hot flashes and New Diabetes. Hematology Not Present- Blood Thinners, Easy Bruising, Excessive bleeding, Gland problems, HIV and  Persistent Infections.  Vitals  Weight: 216.4 lb Height: 68in Body Surface Area: 2.11 m Body Mass Index: 32.9 kg/m  Temp.: 98.21F(Oral)  Pulse: 73 (Regular)  BP: 118/68 (Sitting, Left Arm, Standard)  Physical Exam  The physical exam findings are as follows: Note:CONSTITUTIONAL See vital signs recorded above  GENERAL APPEARANCE Development: normal Nutritional status: normal Gross deformities: none  SKIN Rash, lesions, ulcers: none Induration, erythema: none Nodules: none palpable  EYES Conjunctiva and lids: normal Pupils: equal and reactive Iris: normal bilaterally  EARS, NOSE, MOUTH, THROAT External ears: no lesion or deformity External nose: no lesion or deformity Hearing: grossly normal Lips: no lesion or deformity Dentition: normal for age Oral mucosa: moist  NECK Symmetric: yes Trachea: midline Thyroid: Gland is small, mildly firm, no discrete or dominant masses palpable  CHEST Respiratory effort: normal Retraction or accessory muscle use: no Breath sounds: normal bilaterally Rales, rhonchi, wheeze: none  CARDIOVASCULAR Auscultation: regular rhythm, normal rate Murmurs: none Pulses: carotid and radial pulse 2+ palpable Lower extremity edema: none Lower extremity varicosities: none  MUSCULOSKELETAL Station and gait: normal Digits and nails: no clubbing or cyanosis Muscle strength: grossly normal all extremities Range of motion: grossly normal all extremities Deformity: none  LYMPHATIC Cervical: none palpable Supraclavicular: none palpable Axillary: none palpable  PSYCHIATRIC Oriented to person, place, and time: yes Mood and affect: normal for situation Judgment and insight: appropriate for situation    Assessment & Plan  HASHIMOTO'S DISEASE  (E06.3) NEOPLASM OF UNCERTAIN BEHAVIOR OF THYROID GLAND (D44.0)  Pt Education - Pamphlet Given - The Thyroid Book: discussed with patient and provided information. Patient presents today for evaluation of Hashimoto's thyroiditis and a newly diagnosed thyroid nodule with cytologic atypia. She is accompanied by her husband. She is provided with written literature on thyroid disease to review at home.  Patient has a 1.5 cm nodule in the right thyroid lobe. There are bilateral subcentimeter nodules. Gland is heterogeneous consistent with Hashimoto's thyroiditis.  We discussed options for management. These consist of repeat biopsy with molecular genetic testing, thyroid lobectomy, or total thyroidectomy. We discussed the pros and cons of each of these options. I provided the patient with copies of her ultrasound and her site of pathology reports.  Patient is given careful consideration to her options for management. At this time, she would like to proceed with total thyroidectomy. We discussed the risk and benefits of this procedure including the risk of recurrent laryngeal nerve injury and injury to parathyroid glands. We discussed the location of the surgical incision. We discussed the hospital stay to be anticipated. We discussed lifelong need for thyroid hormone replacement. We discussed the potential need for radioactive iodine treatment in the event of malignancy. Patient understands and wishes to proceed with surgery in the near future.  The risks and benefits of the procedure have been discussed at length with the patient. The patient understands the proposed procedure, potential alternative treatments, and the course of recovery to be expected. All of the patient's questions have been answered at this time. The patient wishes to proceed with surgery.  Kimberly Regal, MD, [facility] Office: [phone]

## 2017-06-18 NOTE — Anesthesia Preprocedure Evaluation (Addendum)
Anesthesia Evaluation  Patient identified by MRN, date of birth, ID band Patient awake    Reviewed: Allergy & Precautions, NPO status , Patient's Chart, lab work & pertinent test results  Airway Mallampati: I  TM Distance: >3 FB Neck ROM: Full    Dental no notable dental hx.    Pulmonary Current Smoker (vapor),    Pulmonary exam normal breath sounds clear to auscultation       Cardiovascular negative cardio ROS Normal cardiovascular exam Rhythm:Regular Rate:Normal     Neuro/Psych negative neurological ROS  negative psych ROS   GI/Hepatic negative GI ROS, Neg liver ROS,   Endo/Other  Hypothyroidism   Renal/GU negative Renal ROS  negative genitourinary   Musculoskeletal negative musculoskeletal ROS (+)   Abdominal   Peds negative pediatric ROS (+)  Hematology negative hematology ROS (+)   Anesthesia Other Findings Obese, BMI 33 hcg negative  Reproductive/Obstetrics negative OB ROS                            Anesthesia Physical Anesthesia Plan  ASA: II  Anesthesia Plan: General   Post-op Pain Management:    Induction: Intravenous  PONV Risk Score and Plan: 2 and Ondansetron, Dexamethasone, Propofol and Midazolam  Airway Management Planned: Oral ETT  Additional Equipment:   Intra-op Plan:   Post-operative Plan: Extubation in OR  Informed Consent: I have reviewed the patients History and Physical, chart, labs and discussed the procedure including the risks, benefits and alternatives for the proposed anesthesia with the patient or authorized representative who has indicated his/her understanding and acceptance.   Dental advisory given  Plan Discussed with: CRNA  Anesthesia Plan Comments:         Anesthesia Quick Evaluation

## 2017-06-19 ENCOUNTER — Ambulatory Visit (HOSPITAL_COMMUNITY): Payer: Managed Care, Other (non HMO) | Admitting: Anesthesiology

## 2017-06-19 ENCOUNTER — Encounter (HOSPITAL_COMMUNITY)
Admission: RE | Disposition: A | Discharge status: Home / Self Care | Payer: Self-pay | Source: Ambulatory Visit | Attending: Surgery

## 2017-06-19 ENCOUNTER — Observation Stay (HOSPITAL_COMMUNITY)
Admission: RE | Admit: 2017-06-19 | Discharge: 2017-06-20 | Disposition: A | Discharge status: Home / Self Care | Payer: Managed Care, Other (non HMO) | Source: Ambulatory Visit | Attending: Surgery | Admitting: Surgery

## 2017-06-19 ENCOUNTER — Encounter (HOSPITAL_COMMUNITY): Payer: Self-pay | Admitting: General Practice

## 2017-06-19 DIAGNOSIS — Z836 Family history of other diseases of the respiratory system: Secondary | ICD-10-CM | POA: Insufficient documentation

## 2017-06-19 DIAGNOSIS — E063 Autoimmune thyroiditis: Secondary | ICD-10-CM | POA: Diagnosis not present

## 2017-06-19 DIAGNOSIS — F17200 Nicotine dependence, unspecified, uncomplicated: Secondary | ICD-10-CM | POA: Insufficient documentation

## 2017-06-19 DIAGNOSIS — E041 Nontoxic single thyroid nodule: Secondary | ICD-10-CM | POA: Diagnosis present

## 2017-06-19 DIAGNOSIS — Z8249 Family history of ischemic heart disease and other diseases of the circulatory system: Secondary | ICD-10-CM | POA: Insufficient documentation

## 2017-06-19 DIAGNOSIS — E669 Obesity, unspecified: Secondary | ICD-10-CM | POA: Insufficient documentation

## 2017-06-19 DIAGNOSIS — Z79899 Other long term (current) drug therapy: Secondary | ICD-10-CM | POA: Insufficient documentation

## 2017-06-19 DIAGNOSIS — Z6833 Body mass index (BMI) 33.0-33.9, adult: Secondary | ICD-10-CM | POA: Insufficient documentation

## 2017-06-19 DIAGNOSIS — D34 Benign neoplasm of thyroid gland: Principal | ICD-10-CM | POA: Insufficient documentation

## 2017-06-19 DIAGNOSIS — Z803 Family history of malignant neoplasm of breast: Secondary | ICD-10-CM | POA: Insufficient documentation

## 2017-06-19 DIAGNOSIS — E039 Hypothyroidism, unspecified: Secondary | ICD-10-CM | POA: Diagnosis not present

## 2017-06-19 DIAGNOSIS — D440 Neoplasm of uncertain behavior of thyroid gland: Secondary | ICD-10-CM

## 2017-06-19 DIAGNOSIS — E890 Postprocedural hypothyroidism: Secondary | ICD-10-CM | POA: Diagnosis present

## 2017-06-19 HISTORY — PX: THYROIDECTOMY: SHX17

## 2017-06-19 SURGERY — THYROIDECTOMY
Anesthesia: General | Site: Neck

## 2017-06-19 MED ORDER — DIPHENHYDRAMINE HCL 50 MG/ML IJ SOLN
INTRAMUSCULAR | Status: AC
  Filled 2017-06-19: qty 1

## 2017-06-19 MED ORDER — CHLORHEXIDINE GLUCONATE CLOTH 2 % EX PADS: 6.0000 | MEDICATED_PAD | Freq: Once | CUTANEOUS | Status: DC

## 2017-06-19 MED ORDER — FENTANYL CITRATE (PF) 100 MCG/2ML IJ SOLN
INTRAMUSCULAR | Status: DC | PRN
  Administered 2017-06-19 (×5): 50 ug via INTRAVENOUS

## 2017-06-19 MED ORDER — DEXAMETHASONE SODIUM PHOSPHATE 10 MG/ML IJ SOLN
INTRAMUSCULAR | Status: AC
  Filled 2017-06-19: qty 1

## 2017-06-19 MED ORDER — DIPHENHYDRAMINE HCL 50 MG/ML IJ SOLN
INTRAMUSCULAR | Status: DC | PRN
  Administered 2017-06-19: 25 mg via INTRAVENOUS

## 2017-06-19 MED ORDER — SUGAMMADEX SODIUM 200 MG/2ML IV SOLN
INTRAVENOUS | Status: DC | PRN
  Administered 2017-06-19: 200 mg via INTRAVENOUS

## 2017-06-19 MED ORDER — MIDAZOLAM HCL 5 MG/5ML IJ SOLN
INTRAMUSCULAR | Status: DC | PRN
  Administered 2017-06-19: 2 mg via INTRAVENOUS

## 2017-06-19 MED ORDER — FENTANYL CITRATE (PF) 250 MCG/5ML IJ SOLN
INTRAMUSCULAR | Status: AC
  Filled 2017-06-19: qty 5

## 2017-06-19 MED ORDER — ROCURONIUM BROMIDE 10 MG/ML (PF) SYRINGE
PREFILLED_SYRINGE | INTRAVENOUS | Status: DC | PRN
  Administered 2017-06-19: 40 mg via INTRAVENOUS

## 2017-06-19 MED ORDER — PROPOFOL 10 MG/ML IV BOLUS
INTRAVENOUS | Status: DC | PRN
  Administered 2017-06-19: 200 mg via INTRAVENOUS

## 2017-06-19 MED ORDER — ONDANSETRON HCL 4 MG/2ML IJ SOLN: 4.0000 mg | Freq: Four times a day (QID) | INTRAMUSCULAR | Status: DC | PRN

## 2017-06-19 MED ORDER — DEXAMETHASONE SODIUM PHOSPHATE 10 MG/ML IJ SOLN
INTRAMUSCULAR | Status: DC | PRN
  Administered 2017-06-19: 10 mg via INTRAVENOUS

## 2017-06-19 MED ORDER — PROMETHAZINE HCL 25 MG/ML IJ SOLN
6.2500 mg | INTRAMUSCULAR | Status: DC | PRN
  Administered 2017-06-19: 6.25 mg via INTRAVENOUS

## 2017-06-19 MED ORDER — HYDROMORPHONE HCL 1 MG/ML IJ SOLN
INTRAMUSCULAR | Status: AC
  Filled 2017-06-19: qty 0.5

## 2017-06-19 MED ORDER — PROPOFOL 10 MG/ML IV BOLUS
INTRAVENOUS | Status: AC
  Filled 2017-06-19: qty 40

## 2017-06-19 MED ORDER — ONDANSETRON HCL 4 MG/2ML IJ SOLN
INTRAMUSCULAR | Status: DC | PRN
  Administered 2017-06-19: 4 mg via INTRAVENOUS

## 2017-06-19 MED ORDER — HYDROMORPHONE HCL 1 MG/ML IJ SOLN: 1.0000 mg | INTRAMUSCULAR | Status: DC | PRN

## 2017-06-19 MED ORDER — IBUPROFEN 600 MG PO TABS
600.0000 mg | ORAL_TABLET | Freq: Four times a day (QID) | ORAL | Status: DC | PRN
  Administered 2017-06-20: 600 mg via ORAL
  Filled 2017-06-19: qty 1

## 2017-06-19 MED ORDER — HYDROMORPHONE HCL 1 MG/ML IJ SOLN
0.2500 mg | INTRAMUSCULAR | Status: DC | PRN
  Administered 2017-06-19: 0.5 mg via INTRAVENOUS

## 2017-06-19 MED ORDER — KETOROLAC TROMETHAMINE 30 MG/ML IJ SOLN
INTRAMUSCULAR | Status: DC | PRN
  Administered 2017-06-19: 30 mg via INTRAVENOUS

## 2017-06-19 MED ORDER — SCOPOLAMINE 1 MG/3DAYS TD PT72
MEDICATED_PATCH | TRANSDERMAL | Status: DC | PRN
  Administered 2017-06-19: 1 via TRANSDERMAL

## 2017-06-19 MED ORDER — OXYCODONE HCL 5 MG PO TABS
ORAL_TABLET | ORAL | Status: AC
  Filled 2017-06-19: qty 1

## 2017-06-19 MED ORDER — OXYCODONE HCL 5 MG/5ML PO SOLN: 5.0000 mg | Freq: Once | ORAL | Status: AC | PRN

## 2017-06-19 MED ORDER — HEMOSTATIC AGENTS (NO CHARGE) OPTIME
TOPICAL | Status: DC | PRN
  Administered 2017-06-19: 1 via TOPICAL

## 2017-06-19 MED ORDER — 0.9 % SODIUM CHLORIDE (POUR BTL) OPTIME
TOPICAL | Status: DC | PRN
  Administered 2017-06-19: 1000 mL

## 2017-06-19 MED ORDER — LACTATED RINGERS IV SOLN
INTRAVENOUS | Status: DC | PRN
  Administered 2017-06-19 (×2): via INTRAVENOUS

## 2017-06-19 MED ORDER — MIDAZOLAM HCL 2 MG/2ML IJ SOLN
INTRAMUSCULAR | Status: AC
  Filled 2017-06-19: qty 2

## 2017-06-19 MED ORDER — ONDANSETRON 4 MG PO TBDP: 4.0000 mg | ORAL_TABLET | Freq: Four times a day (QID) | ORAL | Status: DC | PRN

## 2017-06-19 MED ORDER — PHENYLEPHRINE HCL 10 MG/ML IJ SOLN
INTRAMUSCULAR | Status: DC | PRN
  Administered 2017-06-19 (×2): 80 ug via INTRAVENOUS

## 2017-06-19 MED ORDER — LIDOCAINE 2% (20 MG/ML) 5 ML SYRINGE
INTRAMUSCULAR | Status: DC | PRN
  Administered 2017-06-19: 50 mg via INTRAVENOUS
  Administered 2017-06-19: 40 mg via INTRAVENOUS

## 2017-06-19 MED ORDER — PROMETHAZINE HCL 25 MG/ML IJ SOLN
INTRAMUSCULAR | Status: AC
  Filled 2017-06-19: qty 1

## 2017-06-19 MED ORDER — SCOPOLAMINE 1 MG/3DAYS TD PT72
MEDICATED_PATCH | TRANSDERMAL | Status: AC
  Filled 2017-06-19: qty 1

## 2017-06-19 MED ORDER — HYDROCODONE-ACETAMINOPHEN 5-325 MG PO TABS
1.0000 | ORAL_TABLET | ORAL | Status: DC | PRN
  Administered 2017-06-20: 2 via ORAL
  Filled 2017-06-19: qty 2

## 2017-06-19 MED ORDER — KCL IN DEXTROSE-NACL 20-5-0.45 MEQ/L-%-% IV SOLN
INTRAVENOUS | Status: DC
  Administered 2017-06-19: 13:00:00 via INTRAVENOUS
  Filled 2017-06-19: qty 1000

## 2017-06-19 MED ORDER — LIDOCAINE 2% (20 MG/ML) 5 ML SYRINGE
INTRAMUSCULAR | Status: AC
  Filled 2017-06-19: qty 10

## 2017-06-19 MED ORDER — CALCIUM CARBONATE 1250 (500 CA) MG PO TABS
2.0000 | ORAL_TABLET | Freq: Three times a day (TID) | ORAL | Status: DC
  Administered 2017-06-19 – 2017-06-20 (×3): 1000 mg via ORAL
  Filled 2017-06-19 (×4): qty 1

## 2017-06-19 MED ORDER — ROCURONIUM BROMIDE 10 MG/ML (PF) SYRINGE
PREFILLED_SYRINGE | INTRAVENOUS | Status: AC
  Filled 2017-06-19: qty 5

## 2017-06-19 MED ORDER — OXYCODONE HCL 5 MG PO TABS
5.0000 mg | ORAL_TABLET | Freq: Once | ORAL | Status: AC | PRN
  Administered 2017-06-19: 5 mg via ORAL

## 2017-06-19 SURGICAL SUPPLY — 49 items
ATTRACTOMAT 16X20 MAGNETIC DRP (DRAPES) ×2 IMPLANT
BLADE CLIPPER SURG (BLADE) IMPLANT
BLADE SURG 10 STRL SS (BLADE) ×2 IMPLANT
BLADE SURG 15 STRL LF DISP TIS (BLADE) ×1 IMPLANT
BLADE SURG 15 STRL SS (BLADE) ×1
CANISTER SUCT 3000ML PPV (MISCELLANEOUS) ×2 IMPLANT
CHLORAPREP W/TINT 10.5 ML (MISCELLANEOUS) ×2 IMPLANT
CLIP TI MEDIUM 24 (CLIP) ×2 IMPLANT
CLIP TI WIDE RED SMALL 24 (CLIP) ×2 IMPLANT
CLSR STERI-STRIP ANTIMIC 1/2X4 (GAUZE/BANDAGES/DRESSINGS) ×2 IMPLANT
COVER SURGICAL LIGHT HANDLE (MISCELLANEOUS) ×2 IMPLANT
CRADLE DONUT ADULT HEAD (MISCELLANEOUS) ×2 IMPLANT
DRAPE LAPAROTOMY 100X72 PEDS (DRAPES) ×2 IMPLANT
DRAPE UTILITY XL STRL (DRAPES) ×2 IMPLANT
ELECT CAUTERY BLADE 6.4 (BLADE) ×2 IMPLANT
ELECT REM PT RETURN 9FT ADLT (ELECTROSURGICAL) ×2
ELECTRODE REM PT RTRN 9FT ADLT (ELECTROSURGICAL) ×1 IMPLANT
GAUZE SPONGE 4X4 12PLY STRL (GAUZE/BANDAGES/DRESSINGS) ×2 IMPLANT
GAUZE SPONGE 4X4 16PLY XRAY LF (GAUZE/BANDAGES/DRESSINGS) ×2 IMPLANT
GLOVE BIO SURGEON STRL SZ7.5 (GLOVE) ×2 IMPLANT
GLOVE BIOGEL PI IND STRL 7.5 (GLOVE) ×1 IMPLANT
GLOVE BIOGEL PI INDICATOR 7.5 (GLOVE) ×1
GLOVE SURG ORTHO 8.0 STRL STRW (GLOVE) ×2 IMPLANT
GOWN STRL REUS W/ TWL LRG LVL3 (GOWN DISPOSABLE) ×1 IMPLANT
GOWN STRL REUS W/ TWL XL LVL3 (GOWN DISPOSABLE) ×1 IMPLANT
GOWN STRL REUS W/TWL LRG LVL3 (GOWN DISPOSABLE) ×1
GOWN STRL REUS W/TWL XL LVL3 (GOWN DISPOSABLE) ×1
HEMOSTAT SURGICEL 2X4 FIBR (HEMOSTASIS) ×2 IMPLANT
ILLUMINATOR WAVEGUIDE N/F (MISCELLANEOUS) ×2 IMPLANT
KIT BASIN OR (CUSTOM PROCEDURE TRAY) ×2 IMPLANT
KIT ROOM TURNOVER OR (KITS) ×2 IMPLANT
LIGHT WAVEGUIDE WIDE FLAT (MISCELLANEOUS) IMPLANT
NS IRRIG 1000ML POUR BTL (IV SOLUTION) ×2 IMPLANT
PACK SURGICAL SETUP 50X90 (CUSTOM PROCEDURE TRAY) ×2 IMPLANT
PAD ARMBOARD 7.5X6 YLW CONV (MISCELLANEOUS) ×2 IMPLANT
PENCIL BUTTON HOLSTER BLD 10FT (ELECTRODE) ×2 IMPLANT
SHEARS HARMONIC 9CM CVD (BLADE) ×2 IMPLANT
SPECIMEN JAR MEDIUM (MISCELLANEOUS) ×2 IMPLANT
SPONGE INTESTINAL PEANUT (DISPOSABLE) ×2 IMPLANT
STRIP CLOSURE SKIN 1/2X4 (GAUZE/BANDAGES/DRESSINGS) ×2 IMPLANT
SUT MNCRL AB 4-0 PS2 18 (SUTURE) ×2 IMPLANT
SUT SILK 2 0 (SUTURE) ×1
SUT SILK 2-0 18XBRD TIE 12 (SUTURE) ×1 IMPLANT
SUT VIC AB 3-0 SH 18 (SUTURE) ×6 IMPLANT
SYR BULB 3OZ (MISCELLANEOUS) ×2 IMPLANT
TAPE CLOTH SURG 4X10 WHT LF (GAUZE/BANDAGES/DRESSINGS) ×2 IMPLANT
TOWEL OR 17X24 6PK STRL BLUE (TOWEL DISPOSABLE) ×2 IMPLANT
TOWEL OR 17X26 10 PK STRL BLUE (TOWEL DISPOSABLE) ×2 IMPLANT
TUBE CONNECTING 12X1/4 (SUCTIONS) ×2 IMPLANT

## 2017-06-19 NOTE — Anesthesia Postprocedure Evaluation (Signed)
Anesthesia Post Note  Patient: Kimberly Pennington  Procedure(s) Performed: Procedure(s) (LRB): TOTAL THYROIDECTOMY (N/A)     Patient location during evaluation: PACU Anesthesia Type: General Level of consciousness: awake and alert Pain management: pain level controlled Vital Signs Assessment: post-procedure vital signs reviewed and stable Respiratory status: spontaneous breathing, nonlabored ventilation, respiratory function stable and patient connected to nasal cannula oxygen Cardiovascular status: blood pressure returned to baseline and stable Postop Assessment: no signs of nausea or vomiting Anesthetic complications: no    Last Vitals:  Vitals:   06/19/17 1046 06/19/17 1107  BP: 127/81 117/73  Pulse: 74 70  Resp: 13 15  Temp:  36.3 C    Last Pain:  Vitals:   06/19/17 1110  TempSrc:   PainSc: 2                  Ryan P Ellender

## 2017-06-19 NOTE — Anesthesia Procedure Notes (Signed)
Procedure Name: Intubation Date/Time: 06/19/2017 7:37 AM Performed by: Valda Favia Pre-anesthesia Checklist: Patient identified, Emergency Drugs available, Suction available, Patient being monitored and Timeout performed Patient Re-evaluated:Patient Re-evaluated prior to inductionOxygen Delivery Method: Circle system utilized Preoxygenation: Pre-oxygenation with 100% oxygen Intubation Type: IV induction Ventilation: Mask ventilation without difficulty Laryngoscope Size: Mac and 4 Grade View: Grade II Tube type: Oral Tube size: 7.0 mm Number of attempts: 1 Airway Equipment and Method: Stylet and LTA kit utilized Placement Confirmation: ETT inserted through vocal cords under direct vision,  positive ETCO2 and breath sounds checked- equal and bilateral Secured at: 21 cm Tube secured with: Tape Dental Injury: Teeth and Oropharynx as per pre-operative assessment

## 2017-06-19 NOTE — Op Note (Signed)
Procedure Note  Pre-operative Diagnosis:  Right thyroid nodule with cytologic atypia, Hashimoto's thyroiditis  Post-operative Diagnosis:  same  Surgeon:  Earnstine Regal, MD, FACS  Assistant:  Sharyn Dross, RNFA   Procedure:  Total thyroidectomy  Anesthesia:  General  Estimated Blood Loss:  minimal  Drains: none         Specimen: thyroid to pathology  Indications:  The patient is a 46 year old female who presents with a thyroid nodule.  Patient is referred by Dr. Philemon Kingdom for evaluation of thyroid nodule with cytologic atypia and Hashimoto's thyroiditis. Patient has approximately a 10 year history of thyroid problems. She was diagnosed with Hashimoto's thyroiditis. She has been treated with thyroid medication for approximately 10 years. She is currently taking Armour Thyroid 60 mg daily. Most recent TSH level is normal at 0.66. Patient has a history of biopsy of the thyroid at the time of a thyroid cyst. This was benign. She has been followed with ultrasound exams. Patient had an ultrasound in May 2018 which demonstrated a small thyroid gland with the right lobe measuring 3.7 cm and left lobe measuring 3.1 cm. In the right lobe was a dominant nodule measuring 1.5 cm and biopsy was recommended. Cytopathology shows a follicular neoplasm, Bethesda category IV. Patient is now referred for surgical evaluation for definitive diagnosis and management.  Procedure Details: Procedure was done in OR #2 at the [facility].  The patient was brought to the operating room and placed in a supine position on the operating room table.  Following administration of general anesthesia, the patient was positioned and then prepped and draped in the usual aseptic fashion.  After ascertaining that an adequate level of anesthesia had been achieved, a Kocher incision was made with #15 blade.  Dissection was carried through subcutaneous tissues and platysma. Hemostasis was achieved with the  electrocautery.  Skin flaps were elevated cephalad and caudad from the thyroid notch to the sternal notch.  The Mahorner self-retaining retractor was placed for exposure.  Strap muscles were incised in the midline and dissection was begun on the left side.  Strap muscles were reflected laterally.  Left thyroid lobe was small and firm without dominant nodules.  The left lobe was gently mobilized with blunt dissection.  Superior pole vessels were dissected out and divided individually between small and medium Ligaclips with the Harmonic scalpel.  The thyroid lobe was rolled anteriorly.  Branches of the inferior thyroid artery were divided between small Ligaclips with the Harmonic scalpel.  Inferior venous tributaries were divided between Ligaclips.  Both the superior and inferior parathyroid glands were identified and preserved on their vascular pedicles.  The recurrent laryngeal nerve was identified and preserved along its course.  The ligament of Gwenlyn Found was released with the electrocautery and the gland was mobilized onto the anterior trachea. Isthmus was mobilized across the midline.  There was no significant pyramidal lobe present.  Dry pack was placed in the left neck.  Next, the right thyroid lobe was gently mobilized with blunt dissection.  Right thyroid lobe was normal in size with a centrally located nodule.  Superior pole vessels were dissected out and divided between small and medium Ligaclips with the Harmonic scalpel.  Superior parathyroid was identified and preserved.  Inferior venous tributaries were divided between medium Ligaclips with the Harmonic scalpel.  The right thyroid lobe was rolled anteriorly and the branches of the inferior thyroid artery divided between small Ligaclips.  The right recurrent laryngeal nerve was identified  and preserved along its course.  The ligament of Gwenlyn Found was released with the electrocautery.  The right thyroid lobe was mobilized onto the anterior trachea and the  remainder of the thyroid was dissected off the anterior trachea and the thyroid was completely excised.  A suture was used to mark the right lobe. The entire thyroid gland was submitted to pathology for review.  The neck was irrigated with warm saline.  Fibrillar was placed throughout the operative field.  Strap muscles were reapproximated in the midline with interrupted 3-0 Vicryl sutures.  Platysma was closed with interrupted 3-0 Vicryl sutures.  Skin was closed with a running 4-0 Monocryl subcuticular suture.  Wound was washed and dried and steri-strips were applied.  Dry gauze dressing was placed.  The patient was awakened from anesthesia and brought to the recovery room.  The patient tolerated the procedure well.   Earnstine Regal, MD, [facility] Office: [phone]

## 2017-06-19 NOTE — Interval H&P Note (Signed)
History and Physical Interval Note:  06/19/2017 7:14 AM  Kimberly Pennington  has presented today for surgery, with the diagnosis of Thyroid neoplasm of uncertain behavior  The various methods of treatment have been discussed with the patient and family. After consideration of risks, benefits and other options for treatment, the patient has consented to  Procedure(s): TOTAL THYROIDECTOMY (N/A) as a surgical intervention .  The patient's history has been reviewed, patient examined, no change in status, stable for surgery.  I have reviewed the patient's chart and labs.  Questions were answered to the patient's satisfaction.    Earnstine Regal, MD, [facility] Office: [place]

## 2017-06-19 NOTE — Transfer of Care (Signed)
Immediate Anesthesia Transfer of Care Note  Patient: Kimberly Pennington  Procedure(s) Performed: Procedure(s): TOTAL THYROIDECTOMY (N/A)  Patient Location: PACU  Anesthesia Type:General  Level of Consciousness: patient cooperative and responds to stimulation  Airway & Oxygen Therapy: Patient Spontanous Breathing and Patient connected to nasal cannula oxygen  Post-op Assessment: Report given to RN and Post -op Vital signs reviewed and stable  Post vital signs: Reviewed and stable  Last Vitals:  Vitals:   06/19/17 0614 06/19/17 0637  BP: 113/70   Pulse: 62   Resp: 18   Temp:  36.6 C    Last Pain: There were no vitals filed for this visit.    Patients Stated Pain Goal: 2 (10/19/84 2778)  Complications: No apparent anesthesia complications

## 2017-06-20 ENCOUNTER — Encounter (HOSPITAL_COMMUNITY): Payer: Self-pay | Admitting: Surgery

## 2017-06-20 DIAGNOSIS — D34 Benign neoplasm of thyroid gland: Secondary | ICD-10-CM | POA: Diagnosis not present

## 2017-06-20 LAB — BASIC METABOLIC PANEL
Anion gap: 7 (ref 5–15)
BUN: 7 mg/dL (ref 6–20)
CHLORIDE: 106 mmol/L (ref 101–111)
CO2: 26 mmol/L (ref 22–32)
Calcium: 9 mg/dL (ref 8.9–10.3)
Creatinine, Ser: 0.92 mg/dL (ref 0.44–1.00)
GFR calc Af Amer: >60 mL/min (ref >60)
GFR calc non Af Amer: >60 mL/min (ref >60)
Glucose, Bld: 140 mg/dL — ABNORMAL HIGH (ref 65–99)
POTASSIUM: 4.4 mmol/L (ref 3.5–5.1)
SODIUM: 139 mmol/L (ref 135–145)

## 2017-06-20 MED ORDER — HYDROCODONE-ACETAMINOPHEN 5-325 MG PO TABS: 1.0000 | ORAL_TABLET | ORAL | 0 refills | Status: DC | PRN

## 2017-06-20 MED ORDER — CALCIUM CARBONATE ANTACID 500 MG PO CHEW: 2.0000 | CHEWABLE_TABLET | Freq: Two times a day (BID) | ORAL | 1 refills | Status: DC

## 2017-06-20 NOTE — Discharge Summary (Signed)
Physician Discharge Summary [facility]  Patient ID: Kimberly Pennington MRN: [id number] DOB/AGE: Sep 18, 1971 46 y.o.  Admit date: 06/19/2017 Discharge date: 06/20/2017  Admission Diagnoses:  Thyroid neoplasm of uncertain behavior, Hashimoto's thyroiditis  Discharge Diagnoses:  Principal Problem:   Neoplasm of uncertain behavior of thyroid gland Active Problems:   Hypothyroidism due to Hashimoto's thyroiditis   Right thyroid nodule   Discharged Condition: good  Hospital Course: Patient was admitted for observation following thyroid surgery.  Post op course was uncomplicated.  Pain was well controlled.  Tolerated diet.  Post op calcium level on morning following surgery was pending at the time of rounds.  Patient was prepared for discharge home on POD#1.  Consults: None  Treatments: surgery: total thyroidectomy  Discharge Exam: Blood pressure (!) 117/53, pulse (!) 53, temperature 97.8 F (36.6 C), temperature source Oral, resp. rate 19, height 5\' 8"  (1.727 m), weight 99.8 kg (220 lb), last menstrual period 06/13/2017, SpO2 95 %. HEENT - clear Neck - wound dry and intact, mild STS; voice normal Chest - clear bilaterally Cor - RRR  Disposition: Home  Discharge Instructions    Apply dressing    Complete by:  As directed    Apply light gauze dressing to wound before discharge home today.   Diet - low sodium heart healthy    Complete by:  As directed    Discharge instructions    Complete by:  As directed    [facility]  THYROID & PARATHYROID SURGERY:  POST-OP INSTRUCTIONS  Always review your discharge instruction sheet from the facility where your surgery was performed.  A prescription for pain medication may be given to you upon discharge.  Take your pain medication as prescribed.  If narcotic pain medicine is not needed, then you may take acetaminophen (Tylenol) or ibuprofen (Advil) as needed.  Take your usually prescribed medications  unless otherwise directed.  If you need a refill on your pain medication, please contact our office during regular business hours.  Prescriptions will not be processed by our office after 5 pm or on weekends.  Start with a light diet upon arrival home, such as soup and crackers or toast.  Be sure to drink plenty of fluids daily.  Resume your normal diet the day after surgery.  Most patients will experience some swelling and bruising on the chest and neck area.  Ice packs will help.  Swelling and bruising can take several days to resolve.   It is common to experience some constipation after surgery.  Increasing fluid intake and taking a stool softener (Colace) will usually help or prevent this problem.  A mild laxative (Milk of Magnesia or Miralax) should be taken according to package directions if there has been no bowel movement after 48 hours.  You have steri-strips and a gauze dressing over your incision.  You may remove the gauze bandage on the second day after surgery, and you may shower at that time.  Leave your steri-strips (small skin tapes) in place directly over the incision.  These strips should remain on the skin for 5-7 days and then be removed.  You may get them wet in the shower and pat them dry.  You may resume regular (light) daily activities beginning the next day - such as daily self-care, walking, climbing stairs - gradually increasing activities as tolerated.  You may have sexual intercourse when it is comfortable.  Refrain from any heavy lifting or straining until approved by your doctor.  You may drive when you no longer are taking prescription pain medication, you can comfortably wear a seatbelt, and you can safely maneuver your car and apply brakes.  You should see your doctor in the office for a follow-up appointment approximately three weeks after your surgery.  Make sure that you call for this appointment within a day or two after you arrive home to insure a convenient  appointment time.  WHEN TO CALL YOUR DOCTOR: -- Fever greater than 101.5 -- Inability to urinate -- Nausea and/or vomiting - persistent -- Extreme swelling or bruising -- Continued bleeding from incision -- Increased pain, redness, or drainage from the incision -- Difficulty swallowing or breathing -- Muscle cramping or spasms -- Numbness or tingling in hands or around lips  The clinic staff is available to answer your questions during regular business hours.  Please don't hesitate to call and ask to speak to one of the nurses if you have concerns.  Earnstine Regal, MD, [facility] Office: [phone]  Website: [email or web address]   Increase activity slowly    Complete by:  As directed    Remove dressing in 24 hours    Complete by:  As directed      Allergies as of 06/20/2017      Reactions   No Known Allergies       Medication List    TAKE these medications   ARMOUR THYROID 60 MG tablet Generic drug:  thyroid Take 1 tablet (60 mg total) by mouth daily before breakfast. What changed:  when to take this  additional instructions   thyroid 90 MG tablet Commonly known as:  ARMOUR THYROID Alternate every other day with the 60 mg Armour. What changed:  how much to take  how to take this  when to take this  additional instructions   calcium carbonate 500 MG chewable tablet Commonly known as:  TUMS Chew 2 tablets (400 mg of elemental calcium total) by mouth 2 (two) times daily.   HYDROcodone-acetaminophen 5-325 MG tablet Commonly known as:  NORCO/VICODIN Take 1-2 tablets by mouth every 4 (four) hours as needed for moderate pain.   ibuprofen 200 MG tablet Commonly known as:  ADVIL,MOTRIN Take 400 mg by mouth every 8 (eight) hours as needed (for pain).      Follow-up Information    Armandina Gemma, MD. Schedule an appointment as soon as possible for a visit in 3 week(s).   Specialty:  General  Surgery Contact information: [address] [place] [address] [phone]           Earnstine Regal, MD, [facility] Office: [phone]   Signed: Earnstine Regal 06/20/2017, 8:01 AM

## 2017-06-20 NOTE — Progress Notes (Signed)
Please contact patient and notify of benign pathology results.  Lauralynn Loeb M. Tyniah Kastens, MD, FACS [facility] Office: [phone]   

## 2017-06-20 NOTE — Discharge Instructions (Signed)
[facility] ° °THYROID & PARATHYROID SURGERY:  POST-OP INSTRUCTIONS ° °Always review your discharge instruction sheet from the facility where your surgery was performed. ° °A prescription for pain medication may be given to you upon discharge.  Take your pain medication as prescribed.  If narcotic pain medicine is not needed, then you may take acetaminophen (Tylenol) or ibuprofen (Advil) as needed. ° °Take your usually prescribed medications unless otherwise directed. ° °If you need a refill on your pain medication, please contact our office during regular business hours.  Prescriptions will not be processed by our office after 5 pm or on weekends. ° °Start with a light diet upon arrival home, such as soup and crackers or toast.  Be sure to drink plenty of fluids daily.  Resume your normal diet the day after surgery. ° °Most patients will experience some swelling and bruising on the chest and neck area.  Ice packs will help.  Swelling and bruising can take several days to resolve.  ° °It is common to experience some constipation after surgery.  Increasing fluid intake and taking a stool softener (Colace) will usually help or prevent this problem.  A mild laxative (Milk of Magnesia or Miralax) should be taken according to package directions if there has been no bowel movement after 48 hours. ° °You have steri-strips and a gauze dressing over your incision.  You may remove the gauze bandage on the second day after surgery, and you may shower at that time.  Leave your steri-strips (small skin tapes) in place directly over the incision.  These strips should remain on the skin for 5-7 days and then be removed.  You may get them wet in the shower and pat them dry. ° °You may resume regular (light) daily activities beginning the next day - such as daily self-care, walking, climbing stairs - gradually increasing activities as tolerated.  You may have sexual intercourse when it is comfortable.  Refrain  from any heavy lifting or straining until approved by your doctor.  You may drive when you no longer are taking prescription pain medication, you can comfortably wear a seatbelt, and you can safely maneuver your car and apply brakes. ° °You should see your doctor in the office for a follow-up appointment approximately three weeks after your surgery.  Make sure that you call for this appointment within a day or two after you arrive home to insure a convenient appointment time. ° °WHEN TO CALL YOUR DOCTOR: °-- Fever greater than 101.5 °-- Inability to urinate °-- Nausea and/or vomiting - persistent °-- Extreme swelling or bruising °-- Continued bleeding from incision °-- Increased pain, redness, or drainage from the incision °-- Difficulty swallowing or breathing °-- Muscle cramping or spasms °-- Numbness or tingling in hands or around lips ° °The clinic staff is available to answer your questions during regular business hours.  Please don’t hesitate to call and ask to speak to one of the nurses if you have concerns. ° °Rubina Basinski M. Ravenne Wayment, MD, FACS °General & Endocrine Surgery °[facility] °Office: [phone] ° °Website: [email or web address] ° ° °

## 2017-06-25 IMAGING — US US THYROID BIOPSY
1 series · 11 of 11 positions shown · non-contrast
Comparison: Thyroid ultrasound performed 04/28/2017.

MEDICATIONS:
None

COMPLICATIONS:
None immediate.

INDICATION: Indeterminate right thyroid nodule

EXAM:
ULTRASOUND GUIDED FINE NEEDLE ASPIRATION OF INDETERMINATE RIGHT
THYROID NODULE
TECHNIQUE: Informed written consent was obtained from the patient after a
discussion of the risks, benefits and alternatives to treatment.
Questions regarding the procedure were encouraged and answered. A
timeout was performed prior to the initiation of the procedure.

[Series 1: us thyroid biopsy · 0.04mm/px · 11 acquisitions, 11 frames shown]
[im 1/11]
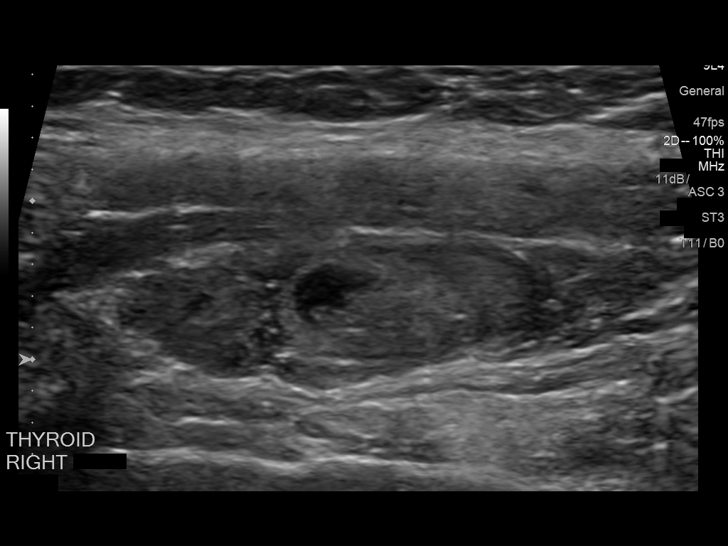
[im 2/11]
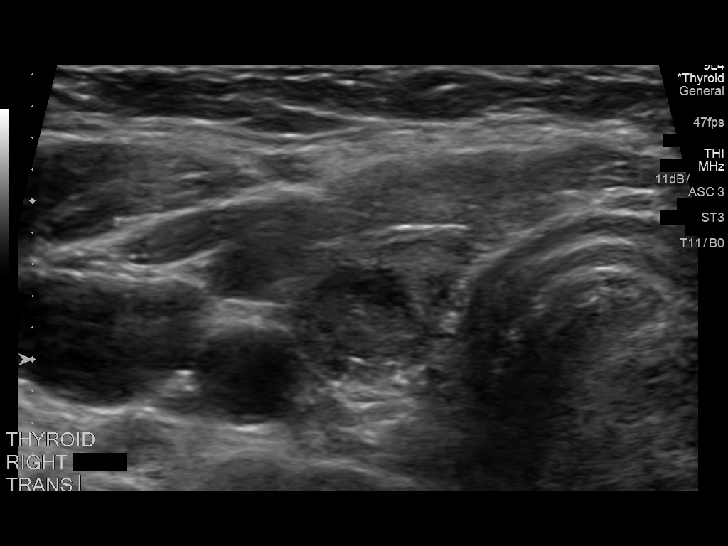
[im 3/11]
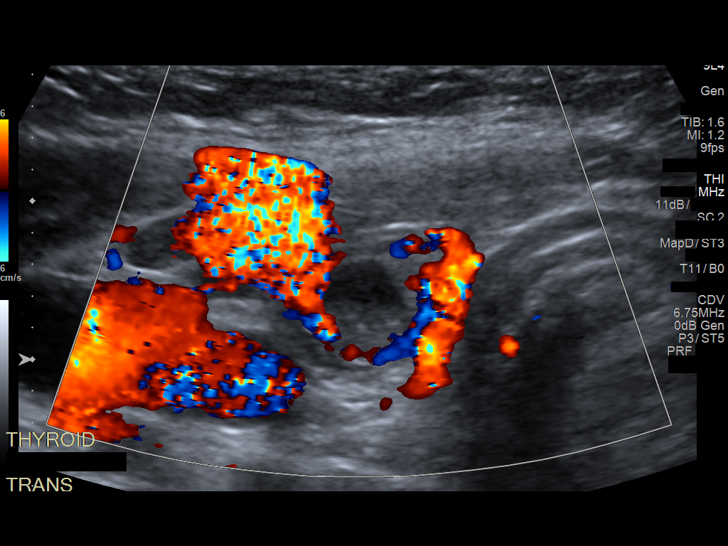
[im 4/11]
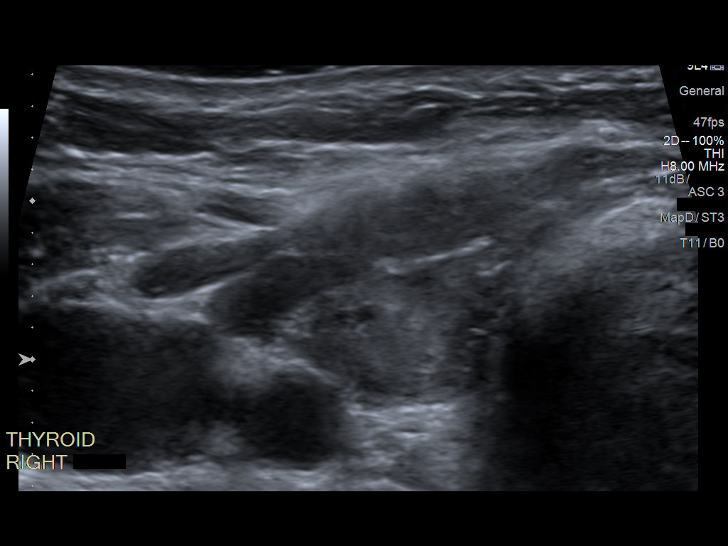
[im 5/11]
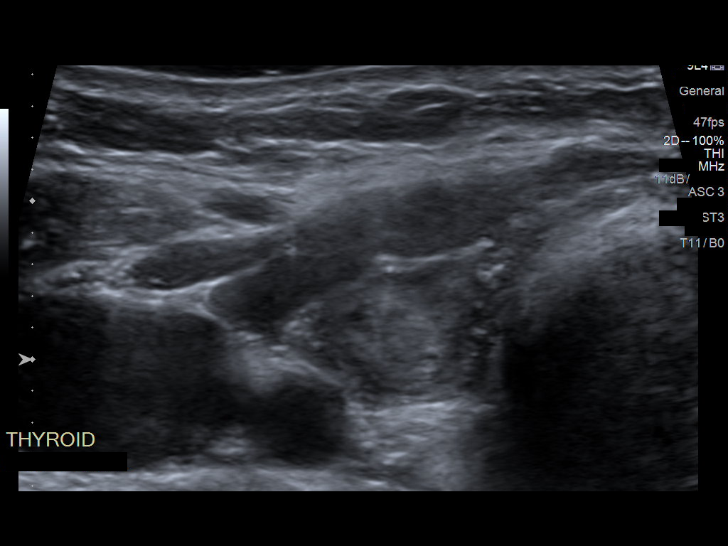
[im 6/11]
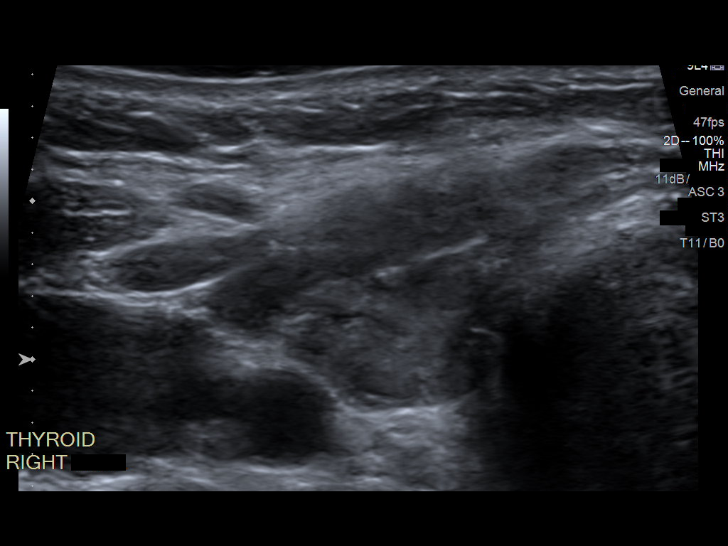
[im 7/11]
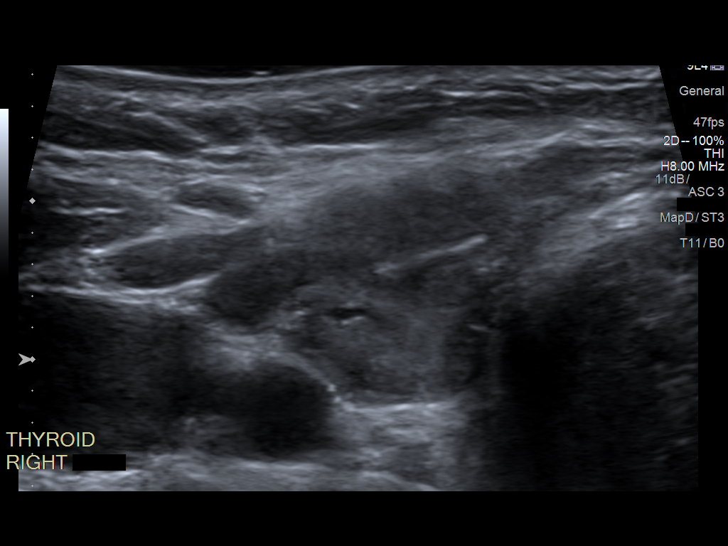
[im 8/11]
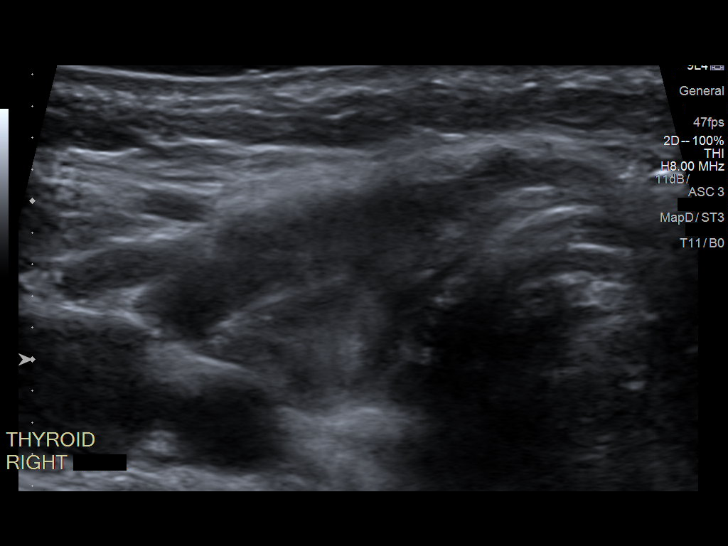
[im 9/11]
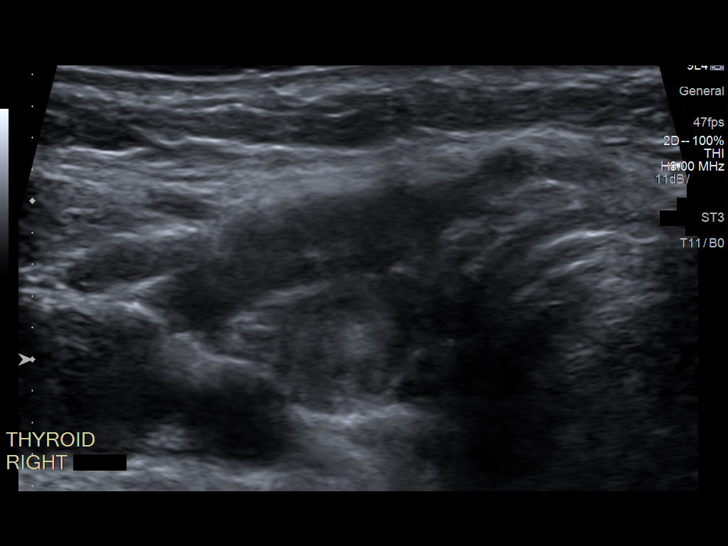
[im 10/11]
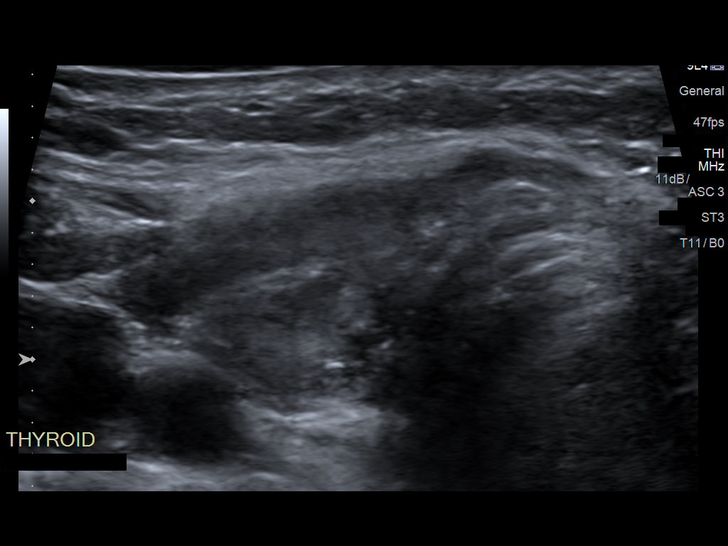
[im 11/11]
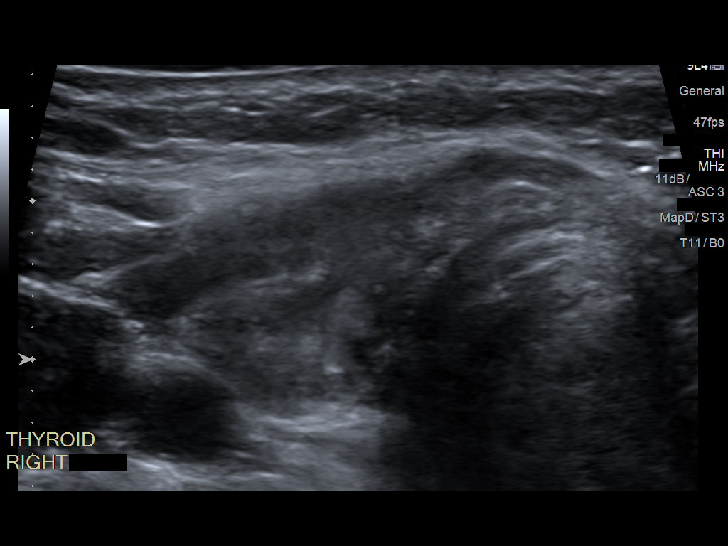

[11 of 11 positions shown; findings below may reference images not displayed]

Pre-procedural ultrasound scanning demonstrated unchanged size and
appearance of the indeterminate nodules within the right mid
thyroid.

The procedure was planned. The neck was prepped in the usual sterile
fashion, and a sterile drape was applied covering the operative
field. A timeout was performed prior to the initiation of the
procedure. Local anesthesia was provided with 1% lidocaine.

Under direct ultrasound guidance, 4 FNA biopsies were performed of
the right mid thyroid nodule with a 25 gauge needle. Multiple
ultrasound images were saved for procedural documentation purposes.
The samples were prepared and submitted to pathology.

Limited post procedural scanning was negative for hematoma or
additional complication. Dressings were placed. The patient
tolerated the above procedures procedure well without immediate
postprocedural complication.
FINDINGS: FINDINGS
Nodule reference number based on prior diagnostic ultrasound: 1

Maximum size:  1.5 cm

Location: Right  ;  Mid

ACR TI-RADS total points: 5

ACR TI-RADS risk category:   TR4 (4-6 points)

Prior biopsy:  No

Reason for biopsy: meets ACR TI-RADS criteria

Ultrasound imaging confirms appropriate placement of the needles
within the thyroid nodule.
IMPRESSION: 1. Technically successful ultrasound guided fine needle aspiration
of right mid thyroid nodule as described above.

## 2017-07-13 ENCOUNTER — Encounter: Payer: Self-pay | Admitting: Internal Medicine

## 2017-07-13 ENCOUNTER — Ambulatory Visit (INDEPENDENT_AMBULATORY_CARE_PROVIDER_SITE_OTHER): Payer: Managed Care, Other (non HMO) | Admitting: Internal Medicine

## 2017-07-13 VITALS — BP 120/78 | HR 75 | Wt 222.0 lb

## 2017-07-13 DIAGNOSIS — E890 Postprocedural hypothyroidism: Secondary | ICD-10-CM | POA: Diagnosis not present

## 2017-07-13 LAB — T3, FREE: T3, Free: 3.4 pg/mL (ref 2.3–4.2)

## 2017-07-13 LAB — TSH: TSH: 0.56 u[IU]/mL (ref 0.35–4.50)

## 2017-07-13 LAB — T4, FREE: FREE T4: 0.5 ng/dL — AB (ref 0.60–1.60)

## 2017-07-13 NOTE — Progress Notes (Signed)
Patient ID: [name], female   DOB: [birth year], 46 y.o.   MRN: [id number]   HPI  Kimberly Pennington is a 46 y.o.-year-old female, whom I previously followed for h/o Hashimoto's hypothyroidism and h/o thyroid nodules, now returning for her first visit after her total thyroidectomy. Last visit 3 months ago.  Reviewed and addended history: Pt. has been dx with hypothyroidism in ~2008 (she also had goiter) >> 2008: high thyroid Abx, TSH 112 >> started on Synthroid 100 mcg daily since 2015 >> we then changed to Armour 60 mg daily.   She had a thyroid U/S then >> R thyroid nodule (complex) - 2 cm >> Bx: benign. She had serial U/S's since then >> including U/S: 2014 - no change in size of the nodule.   Repeat Thyroid ultrasound (04/28/2017): 1. Small heterogeneous thyroid with bilateral nodules. 2. Recommend FNA biopsy of 1.5 cm mid right moderately suspicious nodule.  FNA of the right thyroid nodule (53/66/4403):  - FOLLICULAR NEOPLASM OR SUSPICIOUS FOR A FOLLICULAR NEOPLASM (BETHESDA CATEGORY IV)   Total thyroidectomy (06/19/2017) - Dr. Harlow Asa: benign Thyroid, thyroidectomy, Total Right - FOLLICULAR ADENOMA, 1.4 CM. - LYMPHOCYTIC THYROIDITIS. - ONE BENIGN LYMPH NODE (0/1). - MICROSCOPIC FOCUS OF PARATHYROID TISSUE. - THERE IS NO EVIDENCE OF MALIGNANCY.  Kimberly Cutter MD Pathologist, Electronic Signature (Case signed 06/20/2017)  She feels good after the Sx, with no complaints.  Pt is on Armour 60 mg alternating with 90 mg every other day: - in am - fasting - at least 30 min from b'fast - no Ca, Fe, MVI, PPIs - not on Biotin  She was on calcium postop: 2 Tums for few days >> Calcium 9.6 07/06/2017 >> Tums stopped yestarday.  I reviewed pt's thyroid tests: Lab Results  Component Value Date   TSH 0.66 04/21/2017   TSH 2.22 10/05/2016   TSH 2.49 07/05/2016   FREET4 0.61 04/21/2017   FREET4 0.49 (L) 10/05/2016   FREET4 0.96 07/05/2016   T3FREE 4.1 10/05/2016   T3FREE 2.5  07/05/2016  05/04/2016: TSH 2.04 2008: TSH 112   Office Visit on 07/05/2016  Component Date Value Ref Range Status  . Thyroperoxidase Ab SerPl-aCnc 07/06/2016 368* <9 IU/mL Final  . Thyroglobulin Ab 07/06/2016 3* <2 IU/mL Final   Pt denies: - feeling nodules in neck - hoarseness - dysphagia - choking - SOB with lying down  She has + FH of thyroid disorders in: sister. No FH of thyroid cancer. No h/o radiation tx to head or neck.  No seaweed or kelp. No recent contrast studies. No herbal supplements. No Biotin use. No recent steroids use.   ROS: Constitutional: no weight gain/no weight loss, no fatigue, no subjective hyperthermia, no subjective hypothermia Eyes: no blurry vision, no xerophthalmia ENT: no sore throat, no nodules palpated in throat, no dysphagia, no odynophagia, no hoarseness Cardiovascular: no CP/no SOB/no palpitations/no leg swelling Respiratory: no cough/no SOB/no wheezing Gastrointestinal: no N/no V/no D/no C/no acid reflux Musculoskeletal: no muscle aches/no joint aches Skin: no rashes, no hair loss Neurological: no tremors/no numbness/no tingling/no dizziness  I reviewed pt's medications, allergies, PMH, social hx, family hx, and changes were documented in the history of present illness. Otherwise, unchanged from my initial visit note. PMH: Patient Active Problem List   Diagnosis Date Noted  . 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 core biopsy     R breast- benign   . THYROIDECTOMY  N/A 06/19/2017   Procedure: TOTAL THYROIDECTOMY;  Surgeon: Armandina Gemma, MD;  Location: [facility];  Service: General;  Laterality: N/A;  . TUBAL LIGATION  2000  . VAGINAL DELIVERY     x3- with epidurals  . WISDOM TOOTH EXTRACTION     2 done at a time, in regular dentist office     Social History   Social History  . Marital Status: Married    Spouse Name: N/A  . Number of Children: 3    Occupational History  . Customer service rep    Social History Main Topics  . Smoking status: Yes, Vape  . Alcohol Use: Beer or wine seldom   . Drug Use: No   Current Outpatient Rx  Name  Route  Sig  Dispense  Refill  . Ergocalciferol (VITAMIN D2) 2000 units TABS   Oral   Take by mouth.         . Multiple Vitamin (MULTI-VITAMIN DAILY PO)   Oral   Take by mouth.         . SYNTHROID 100 MCG tablet      take 1 tablet by mouth daily      1     Dispense as written.    Allergies  Allergen Reactions  . No Known Allergies      Family History  Problem Relation Age of Onset  . Heart disease Father   . Hyperlipidemia Father   . Hypertension Father   + see HPI  PE: BP 120/78 (BP Location: Left Arm, Patient Position: Sitting)   Pulse 75   Wt 222 lb (100.7 kg)   LMP 07/09/2017 Comment: has IUD-just has spotting now & then   SpO2 97%   BMI 33.75 kg/m  Wt Readings from Last 3 Encounters:  07/13/17 222 lb (100.7 kg)  06/19/17 220 lb (99.8 kg)  06/13/17 220 lb (99.8 kg)   Constitutional: Obese, in NAD Eyes: PERRLA, EOMI, no exophthalmos ENT: moist mucous membranes, surgical scar healing, no cervical lymphadenopathy Cardiovascular: RRR, No MRG Respiratory: CTA B Gastrointestinal: abdomen soft, NT, ND, BS+ Musculoskeletal: no deformities, strength intact in all 4 Skin: moist, warm, no rashes Neurological: no tremor with outstretched hands, DTR normal in all 4  ASSESSMENT: 1. Hypothyroidism - post surgical  2. h/o R Thyroid nodule  US SOFT TISSUE HEAD AND NECK  Order: [id number]  Status:  Final result Visible to patient:  No (Not Released) Dx:  Left thyroid nodule  Details   Reading Physician Reading Date Result Priority  Arne Cleveland, MD 04/28/2017   Narrative    CLINICAL DATA: Left nodule on physical exam  EXAM: THYROID ULTRASOUND  TECHNIQUE: Ultrasound examination of the thyroid gland and adjacent soft tissues was performed.  COMPARISON:  None.  FINDINGS: Parenchymal Echotexture: Mildly heterogenous  Isthmus: 0.3 cm thickness  Right lobe: 3.7 x 1 x 1.4 cm  Left lobe: 3.1 x 0.7 x 1 cm  _________________________________________________________  Estimated total number of nodules >/= 1 cm: 1  Number of spongiform nodules >/= 2 cm not described below (TR1): 0  Number of mixed cystic and solid nodules >/= 1.5 cm not described below (TR2): 0  _________________________________________________________  Nodule # 1:  Location: Right; Mid  Maximum size: 1.5 cm; Other 2 dimensions: 0.8 x 1 cm  Composition: solid/almost completely solid (2)  Echogenicity: isoechoic (1)  Shape: not taller-than-wide (0)  Margins: smooth (0)  Echogenic foci: peripheral calcifications (2)  ACR TI-RADS total points: 5.  ACR TI-RADS risk category:  TR4 (4-6 points).  ACR TI-RADS recommendations:  **Given size (>/= 1.5 cm) and appearance, fine needle aspiration of this moderately suspicious nodule should be considered based on TI-RADS criteria.  _________________________________________________________  0.7 x 0.5 cm hypoechoic nodule without calcifications, inferior left.  IMPRESSION: 1. Small heterogeneous thyroid with bilateral nodules. 2. Recommend FNA biopsy of 1.5 cm mid right moderately suspicious nodule.  The above is in keeping with the ACR TI-RADS recommendations - J Am Coll Radiol 2017;14:587-595.   Electronically Signed By: Lucrezia Europe M.D. On: 04/28/2017 14:24        Adequacy Reason Satisfactory For Evaluation. Diagnosis THYROID, FINE NEEDLE ASPIRATION, RIGHT RMP (SPECIMEN 1 OF 1, COLLECTED 7/85/88): - FOLLICULAR NEOPLASM OR SUSPICIOUS FOR A FOLLICULAR NEOPLASM (BETHESDA CATEGORY IV) - SEE COMMENT DAWN BUTLER MD Pathologist, Electronic Signature (Case signed 05/11/2017) Specimen Clinical Information Nodule 1 Right Mid 1.5 cm; Other 2 dimensions 0.8 x 1cm solid / almost completely solid,  Isoechoic, ACR TI-RADS total points: 5, Moderatley suspicious nodule Source Thyroid, Fine Needle Aspiration, Rt RMP, (Specimen 1 of 1, collected on 05/10/17 )  Risk of malignancy is 15-30% for this results (per Bethesda classification) >> recommendation is for lobectomy.  06/19/2017: Total thyroidectomy by Dr. Harlow Asa  PLAN:  1. And 2 Patient with h/o Hashimoto's hypothyroidism and thyroid nodules, of which one we Bx'ed in 03/2017 >> intermediate >> she had total thyroidectomy >> benign pathology. She feels well after the Sx, w/o local discomfort.  - she continues on Armour 60 alternating with 90 mg qod. - latest thyroid labs reviewed with pt >> normal  - pt feels good on this dose. - we discussed about taking the thyroid hormone every day, with water, >30 minutes before breakfast, separated by >4 hours from acid reflux medications, calcium, iron, multivitamins. Pt. is taking it correctly - will check thyroid tests today: TSH, fT3, and fT4 - If labs are abnormal, she will need to return for repeat TFTs in 1.5 months - OTW, Return in about 6 months (around 01/13/2018).  Office Visit on 07/13/2017  Component Date Value Ref Range Status  . TSH 07/13/2017 0.56  0.35 - 4.50 uIU/mL Final  . Free T4 07/13/2017 0.50* 0.60 - 1.60 ng/dL Final   Comment: Specimens from patients who are undergoing biotin therapy and /or ingesting biotin supplements may contain high levels of biotin.  The higher biotin concentration in these specimens interferes with this Free T4 assay.  Specimens that contain high levels  of biotin may cause false high results for this Free T4 assay.  Please interpret results in light of the total clinical presentation of the patient.    . T3, Free 07/13/2017 3.4  2.3 - 4.2 pg/mL Final   Msg sent: Dear Kimberly Pennington,  Your free T3 and TSH levels are normal. Free T4 is slightly low, which is not uncommon during treatment with Armour. I would suggest to continue the current dose of Armour.   Sincerely,  Philemon Kingdom MD   Philemon Kingdom, MD PhD [facility]

## 2017-07-13 NOTE — Patient Instructions (Addendum)
Please stop at the lab.  Please continue Armour 60 mg alternating with 90 mg every other day.  Take the thyroid hormone every day, with water, at least 30 minutes before breakfast, separated by at least 4 hours from: - acid reflux medications - calcium - iron - multivitamins  Please come back for a follow-up appointment in 6 months.

## 2017-09-26 ENCOUNTER — Encounter: Payer: Self-pay | Admitting: Internal Medicine

## 2017-09-26 ENCOUNTER — Other Ambulatory Visit: Payer: Self-pay

## 2017-09-26 MED ORDER — ARMOUR THYROID 60 MG PO TABS: 60.0000 mg | ORAL_TABLET | ORAL | 1 refills | Status: DC

## 2018-01-12 ENCOUNTER — Encounter: Payer: Self-pay | Admitting: Internal Medicine

## 2018-01-12 ENCOUNTER — Ambulatory Visit: Payer: Managed Care, Other (non HMO) | Admitting: Internal Medicine

## 2018-01-12 VITALS — BP 112/70 | HR 81 | Ht 68.0 in | Wt 212.4 lb

## 2018-01-12 DIAGNOSIS — E890 Postprocedural hypothyroidism: Secondary | ICD-10-CM | POA: Diagnosis not present

## 2018-01-12 DIAGNOSIS — L299 Pruritus, unspecified: Secondary | ICD-10-CM | POA: Diagnosis not present

## 2018-01-12 LAB — T4, FREE: FREE T4: 0.7 ng/dL (ref 0.60–1.60)

## 2018-01-12 LAB — TSH: TSH: 3.48 u[IU]/mL (ref 0.35–4.50)

## 2018-01-12 MED ORDER — ARMOUR THYROID 60 MG PO TABS: 60.0000 mg | ORAL_TABLET | ORAL | 1 refills | Status: DC

## 2018-01-12 MED ORDER — THYROID 90 MG PO TABS: ORAL_TABLET | ORAL | 1 refills | Status: DC

## 2018-01-12 NOTE — Patient Instructions (Signed)
Please stop at the lab. ? ?Please continue Armour 60 mg alternating with 90 mg every other day. ? ?Take the thyroid hormone every day, with water, at least 30 minutes before breakfast, separated by at least 4 hours from: ?- acid reflux medications ?- calcium ?- iron ?- multivitamins ? ?Please come back for a follow-up appointment in 1 year. ? ?

## 2018-01-12 NOTE — Progress Notes (Signed)
Patient ID: Kimberly Pennington, female   DOB: 1971/03/24, 47 y.o.   MRN: [id number]   HPI  Kimberly Pennington is a 47 y.o.-year-old female, whom I previously followed for h/o Hashimoto's hypothyroidism and h/o thyroid nodules, now with postsurgical hypothyroidism. Last visit 6 months ago.  She had a sinus infection 2 mo ago. She was on ABx.   Reviewed and addended history: Pt. has been dx with hypothyroidism in ~2008 (she also had goiter) >> 2008: high thyroid Abx, TSH 112 >> started on Synthroid 100 mcg daily since 2015 >> we then changed to Armour 60 mg daily.   She had a thyroid U/S then >> R thyroid nodule (complex) - 2 cm >> Bx: Benign. She had serial U/S's since then >> including U/S: 2014 - no change in size of the nodule.   Repeat Thyroid ultrasound (04/28/2017): 1. Small heterogeneous thyroid with bilateral nodules. 2. Recommend FNA biopsy of 1.5 cm mid right moderately suspicious nodule.  FNA of the right thyroid nodule (20/25/4270):  - FOLLICULAR NEOPLASM OR SUSPICIOUS FOR A FOLLICULAR NEOPLASM (BETHESDA CATEGORY IV)   Total thyroidectomy (06/19/2017) - Dr. Harlow Asa: Benign Thyroid, thyroidectomy, Total Right - FOLLICULAR ADENOMA, 1.4 CM. - LYMPHOCYTIC THYROIDITIS. - ONE BENIGN LYMPH NODE (0/1). - MICROSCOPIC FOCUS OF PARATHYROID TISSUE. - THERE IS NO EVIDENCE OF MALIGNANCY.  She feels good after the surgery without complaints.  Pt is on Armour thyroid 60 mg alternating with 90 mg every other day - in am - fasting - at least 30 min from b'fast - no Ca, Fe, MVI, PPIs - not on Biotin She was initially on calcium postop for a few days after the surgery but stopped soon afterwards.  I reviewed pt's thyroid tests -normal at last check 6 months ago: Lab Results  Component Value Date   TSH 0.56 07/13/2017   TSH 0.66 04/21/2017   TSH 2.22 10/05/2016   TSH 2.49 07/05/2016   FREET4 0.50 (L) 07/13/2017   FREET4 0.61 04/21/2017   FREET4 0.49 (L) 10/05/2016   FREET4 0.96  07/05/2016   T3FREE 3.4 07/13/2017   T3FREE 4.1 10/05/2016   T3FREE 2.5 07/05/2016  05/04/2016: TSH 2.04 2008: TSH 112   She has history of Hashimoto's thyroiditis: Office Visit on 07/05/2016  Component Date Value Ref Range Status  . Thyroperoxidase Ab SerPl-aCnc 07/06/2016 368* <9 IU/mL Final  . Thyroglobulin Ab 07/06/2016 3* <2 IU/mL Final   Pt denies: - feeling nodules in neck - hoarseness - dysphagia - choking - SOB with lying down She has + FH of thyroid disorders in: sister. No FH of thyroid cancer. No h/o radiation tx to head or neck.  No seaweed or kelp. No recent contrast studies. No herbal supplements. No Biotin use. No recent steroids use.   ROS: Constitutional: + weight loss, no fatigue, no subjective hyperthermia, no subjective hypothermia Eyes: no blurry vision, no xerophthalmia ENT: no sore throat, + see HPI Cardiovascular: no CP/no SOB/no palpitations/no leg swelling Respiratory: no cough/no SOB/no wheezing Gastrointestinal: no N/no V/no D/no C/no acid reflux Musculoskeletal: no muscle aches/no joint aches Skin: no rashes, no hair loss, + itching at thyroidectomy scar site >> Hydrocortisone lotion Neurological: no tremors/no numbness/no tingling/no dizziness  I reviewed pt's medications, allergies, PMH, social hx, family hx, and changes were documented in the history of present illness. Otherwise, unchanged from my initial visit note.  PMH: Patient Active Problem List   Diagnosis Date Noted  . Postsurgical hypothyroidism 07/05/2016    Priority: High  Past Surgical History:  Procedure Laterality Date  . core biopsy     R breast- benign   . THYROIDECTOMY N/A 06/19/2017   Procedure: TOTAL THYROIDECTOMY;  Surgeon: Armandina Gemma, MD;  Location: [facility];  Service: General;  Laterality: N/A;  . TUBAL LIGATION  2000  . VAGINAL DELIVERY     x3- with epidurals  . WISDOM TOOTH EXTRACTION     2 done at a time, in regular dentist office     Social History    Social History  . Marital Status: Married    Spouse Name: N/A  . Number of Children: 3   Occupational History  . Customer service rep    Social History Main Topics  . Smoking status: Yes, Vape  . Alcohol Use: Beer or wine seldom   . Drug Use: No   Current Outpatient Rx  Name  Route  Sig  Dispense  Refill  . Ergocalciferol (VITAMIN D2) 2000 units TABS   Oral   Take by mouth.         . Multiple Vitamin (MULTI-VITAMIN DAILY PO)   Oral   Take by mouth.         . SYNTHROID 100 MCG tablet      take 1 tablet by mouth daily      1     Dispense as written.    Allergies  Allergen Reactions  . No Known Allergies      Family History  Problem Relation Age of Onset  . Heart disease Father   . Hyperlipidemia Father   . Hypertension Father   + see HPI  PE: BP 112/70   Pulse 81   Ht 5\' 8"  (1.727 m)   Wt 212 lb 6.4 oz (96.3 kg)   SpO2 98%   BMI 32.30 kg/m  Wt Readings from Last 3 Encounters:  01/12/18 212 lb 6.4 oz (96.3 kg)  07/13/17 222 lb (100.7 kg)  06/19/17 220 lb (99.8 kg)   Constitutional: overweight, in NAD Eyes: PERRLA, EOMI, no exophthalmos ENT: moist mucous membranes, no cervical masses palpated, thyroidectomy scar healing, with very little keloid, no cervical lymphadenopathy Cardiovascular: RRR, No MRG Respiratory: CTA B Gastrointestinal: abdomen soft, NT, ND, BS+ Musculoskeletal: no deformities, strength intact in all 4 Skin: moist, warm, no rashes Neurological: no tremor with outstretched hands, DTR normal in all 4  ASSESSMENT: 1. Hypothyroidism - post surgical  2. h/o R Thyroid nodule  Adequacy Reason Satisfactory For Evaluation. Diagnosis THYROID, FINE NEEDLE ASPIRATION, RIGHT RMP (SPECIMEN 1 OF 1, COLLECTED 5/73/22): - FOLLICULAR NEOPLASM OR SUSPICIOUS FOR A FOLLICULAR NEOPLASM (BETHESDA CATEGORY IV) - SEE COMMENT DAWN BUTLER MD Pathologist, Electronic Signature (Case signed 05/11/2017) Specimen Clinical Information Nodule 1  Right Mid 1.5 cm; Other 2 dimensions 0.8 x 1cm solid / almost completely solid, Isoechoic, ACR TI-RADS total points: 5, Moderatley suspicious nodule Source Thyroid, Fine Needle Aspiration, Rt RMP, (Specimen 1 of 1, collected on 05/10/17 )  Risk of malignancy is 15-30% for this results (per Bethesda classification) >> recommendation is for lobectomy.  06/19/2017: Total thyroidectomy by Dr. Harlow Asa  2. Itching at cervical surgery site  PLAN:  1. And 2 Patient with history of Hashimoto's thyroiditis and thyroid nodules, now status post total thyroidectomy for a  indeterminate thyroid nodule biopsied in 03/2017.  She had a thyroidectomy in 05/2017.  Final pathology was benign.  She feels well after the surgery, without cervical scar discomfort. - She continues on Armour 60 alternating with 90 mg every other  day Armour 60 alternating with 90 mg qod. - latest thyroid labs reviewed with pt >> normal  - pt feels good on this dose. - we discussed about taking the thyroid hormone every day, with water, >30 minutes before breakfast, separated by >4 hours from acid reflux medications, calcium, iron, multivitamins. Pt. is taking it correctly. - will check thyroid tests today: TSH, free T3 and fT4 - If labs are abnormal, she will need to return for repeat TFTs in 1.5 months - OTW, RTC in 1 year  2. Itching at cervical surgery site - now using Hydrocortisone cream - discussed trying Benadryl cream, Kelo-cote  Needs refills. [facility].  Office Visit on 01/12/2018  Component Date Value Ref Range Status  . TSH 01/12/2018 3.48  0.35 - 4.50 uIU/mL Final  . Free T4 01/12/2018 0.70  0.60 - 1.60 ng/dL Final   Comment: Specimens from patients who are undergoing biotin therapy and /or ingesting biotin supplements may contain high levels of biotin.  The higher biotin concentration in these specimens interferes with this Free T4 assay.  Specimens that contain high levels  of biotin may cause false high results  for this Free T4 assay.  Please interpret results in light of the total clinical presentation of the patient.     Normal TFTs >> refill same Armour doses. Philemon Kingdom, MD PhD [facility] Endocrinology

## 2018-04-20 ENCOUNTER — Ambulatory Visit: Payer: Managed Care, Other (non HMO) | Admitting: Internal Medicine

## 2018-04-20 ENCOUNTER — Encounter: Payer: Self-pay | Admitting: Internal Medicine

## 2018-04-20 VITALS — BP 124/78 | HR 67 | Resp 17 | Ht 67.8 in | Wt 197.2 lb

## 2018-04-20 DIAGNOSIS — E890 Postprocedural hypothyroidism: Secondary | ICD-10-CM

## 2018-04-20 LAB — T3, FREE: T3 FREE: 4.6 pg/mL — AB (ref 2.3–4.2)

## 2018-04-20 LAB — TSH: TSH: 0.55 u[IU]/mL (ref 0.35–4.50)

## 2018-04-20 LAB — T4, FREE: FREE T4: 0.69 ng/dL (ref 0.60–1.60)

## 2018-04-20 NOTE — Progress Notes (Signed)
Patient ID: Kimberly Pennington, female   DOB: Dec 31, 1970, 47 y.o.   MRN: [id number]   HPI  Kimberly Pennington is a 47 y.o.-year-old femaleemale, returning for follow-up for postsurgical hypothyroidism. Last visit  3 months ago.  She intentionally lost 15 lbs since last visit!  She is very fatigued. She is trying to exercise - walks 2 mi on treadmill.   Reviewed history: Pt. has been dx with hypothyroidism in ~2008 (she also had goiter) >> 2008: high thyroid Abx, TSH 112 >> started on Synthroid 100 mcg daily since 2015 >> we then changed to Armour.  She had a thyroid U/S then >> R thyroid nodule (complex) - 2 cm >> Bx: Benign. She had serial U/S's since then >> including U/S: 2014 - no change in size of the nodule.   Repeat Thyroid ultrasound (04/28/2017): 1. Small heterogeneous thyroid with bilateral nodules. 2. Recommend FNA biopsy of 1.5 cm mid right moderately suspicious nodule.  FNA of the right thyroid nodule (78/29/5621):  - FOLLICULAR NEOPLASM OR SUSPICIOUS FOR A FOLLICULAR NEOPLASM (BETHESDA CATEGORY IV)   Total thyroidectomy (06/19/2017) - Dr. Harlow Asa: Benign Thyroid, thyroidectomy, Total Right - FOLLICULAR ADENOMA, 1.4 CM. - LYMPHOCYTIC THYROIDITIS. - ONE BENIGN LYMPH NODE (0/1). - MICROSCOPIC FOCUS OF PARATHYROID TISSUE. - THERE IS NO EVIDENCE OF MALIGNANCY.  She feels good after surgery, without complaints.  Pt is on Armour 90 alternating with 60 mg every other day, taken - in am - fasting - at least 2h from b'fast - no Ca, Fe, MVI, PPIs - not on Biotin  TFTs normal 3 months ago: Lab Results  Component Value Date   TSH 3.48 01/12/2018   TSH 0.56 07/13/2017   TSH 0.66 04/21/2017   TSH 2.22 10/05/2016   TSH 2.49 07/05/2016   FREET4 0.70 01/12/2018   FREET4 0.50 (L) 07/13/2017   FREET4 0.61 04/21/2017   FREET4 0.49 (L) 10/05/2016   FREET4 0.96 07/05/2016   T3FREE 3.4 07/13/2017   T3FREE 4.1 10/05/2016   T3FREE 2.5 07/05/2016  05/04/2016: TSH 2.04 2008: TSH 112    She has a history of Hashimoto's thyroiditis: Office Visit on 07/05/2016  Component Date Value Ref Range Status  . Thyroperoxidase Ab SerPl-aCnc 07/06/2016 368* <9 IU/mL Final  . Thyroglobulin Ab 07/06/2016 3* <2 IU/mL Final   Pt denies: - feeling nodules in neck - hoarseness - dysphagia - choking - SOB with lying down  She has + FH of thyroid disorders in: sister. No FH of thyroid cancer. No h/o radiation tx to head or neck.  No seaweed or kelp. On wheat grass. No recent contrast studies. No herbal supplements. No Biotin use. No recent steroids use.   ROS: Constitutional: + weight loss, no fatigue, + subjective hyperthermia, no subjective hypothermia Eyes: no blurry vision, no xerophthalmia ENT: no sore throat,  + see HPI Cardiovascular: no CP/no SOB/no palpitations/no leg swelling Respiratory: no cough/no SOB/no wheezing Gastrointestinal: no N/no V/no D/no C/no acid reflux Musculoskeletal: no muscle aches/no joint aches Skin: no rashes, no hair loss Neurological: no tremors/no numbness/no tingling/no dizziness  I reviewed pt's medications, allergies, PMH, social hx, family hx, and changes were documented in the history of present illness. Otherwise, unchanged from my initial visit note.  PMH: Patient Active Problem List   Diagnosis Date Noted  . 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 core biopsy     R breast- benign   .  THYROIDECTOMY N/A 06/19/2017   Procedure: TOTAL THYROIDECTOMY;  Surgeon: Armandina Gemma, MD;  Location: [facility];  Service: General;  Laterality: N/A;  . TUBAL LIGATION  2000  . VAGINAL DELIVERY     x3- with epidurals  . WISDOM TOOTH EXTRACTION     2 done at a time, in regular dentist office     Social History   Social History  . Marital Status: Married    Spouse Name: N/A  . Number of Children: 3   Occupational History  . Customer service rep    Social History Main  Topics  . Smoking status: Yes, Vape  . Alcohol Use: Beer or wine seldom   . Drug Use: No   Current Outpatient Rx  Name  Route  Sig  Dispense  Refill  . Ergocalciferol (VITAMIN D2) 2000 units TABS   Oral   Take by mouth.         . Multiple Vitamin (MULTI-VITAMIN DAILY PO)   Oral   Take by mouth.         . SYNTHROID 100 MCG tablet      take 1 tablet by mouth daily      1     Dispense as written.    Allergies  Allergen Reactions  . No Known Allergies      Family History  Problem Relation Age of Onset  . Heart disease Father   . Hyperlipidemia Father   . Hypertension Father   + see HPI  PE: BP 124/78   Pulse 67   Resp 17   Ht 5' 7.8" (1.722 m)   Wt 197 lb 3.2 oz (89.4 kg)   SpO2 98%   BMI 30.17 kg/m  Wt Readings from Last 3 Encounters:  04/20/18 197 lb 3.2 oz (89.4 kg)  01/12/18 212 lb 6.4 oz (96.3 kg)  07/13/17 222 lb (100.7 kg)   Constitutional: overweight, in NAD Eyes: PERRLA, EOMI, no exophthalmos ENT: moist mucous membranes, thyroidectomy scar healed, no masses palpated in neck, no cervical lymphadenopathy Cardiovascular: RRR, No MRG Respiratory: CTA B Gastrointestinal: abdomen soft, NT, ND, BS+ Musculoskeletal: no deformities, strength intact in all 4 Skin: moist, warm, no rashes Neurological: no tremor with outstretched hands, DTR normal in all 4  ASSESSMENT: 1. Hypothyroidism - post surgical  2. h/o R Thyroid nodule  Adequacy Reason Satisfactory For Evaluation. Diagnosis THYROID, FINE NEEDLE ASPIRATION, RIGHT RMP (SPECIMEN 1 OF 1, COLLECTED 04/03/80): - FOLLICULAR NEOPLASM OR SUSPICIOUS FOR A FOLLICULAR NEOPLASM (BETHESDA CATEGORY IV) - SEE COMMENT DAWN BUTLER MD Pathologist, Electronic Signature (Case signed 05/11/2017) Specimen Clinical Information Nodule 1 Right Mid 1.5 cm; Other 2 dimensions 0.8 x 1cm solid / almost completely solid, Isoechoic, ACR TI-RADS total points: 5, Moderatley suspicious nodule Source Thyroid, Fine  Needle Aspiration, Rt RMP, (Specimen 1 of 1, collected on 05/10/17 )  Risk of malignancy is 15-30% for this results (per Bethesda classification) >> recommendation is for lobectomy.  06/19/2017: Total thyroidectomy by Dr. Harlow Asa   PLAN:  1. And 2.  Patient with history of Hashimoto's thyroiditis and thyroid nodules, now status post total thyroidectomy for an indeterminate thyroid nodule biopsied in 03/2017.  She had total thyroidectomy in 05/2017 by Dr. Harlow Asa.  Final pathology was benign.  She feels well after the surgery, now on Armour thyroid. - latest thyroid labs reviewed with pt >> normal 12/2016 - she continues on Armour 90 alternating with 60 mg every other day - pt feels good on this dose, but is fatigued >>  prefers to have the dose increased if TSH close to the ULN or above. We discussed to increase the dose to 90 mg daily in this case. - we discussed about taking the thyroid hormone every day, with water, >30 minutes before breakfast, separated by >4 hours from acid reflux medications, calcium, iron, multivitamins. Pt. is taking it correctly. - will check thyroid tests today: TSH, free T3 and fT4 - If labs are abnormal, she will need to return for repeat TFTs in 1.5 months - RTC in 1 year  - time spent with the patient: 15 min, of which >50% was spent in obtaining information about her symptoms, reviewing her previous labs, evaluations, and treatments, counseling her about her condition and developing a plan to further investigate and treat it.   Office Visit on 04/20/2018  Component Date Value Ref Range Status  . TSH 04/20/2018 0.55  0.35 - 4.50 uIU/mL Final  . Free T4 04/20/2018 0.69  0.60 - 1.60 ng/dL Final   Comment: Specimens from patients who are undergoing biotin therapy and /or ingesting biotin supplements may contain high levels of biotin.  The higher biotin concentration in these specimens interferes with this Free T4 assay.  Specimens that contain high levels  of biotin  may cause false high results for this Free T4 assay.  Please interpret results in light of the total clinical presentation of the patient.    . T3, Free 04/20/2018 4.6* 2.3 - 4.2 pg/mL Final   TFTs OK, will continue current Armour dose. Philemon Kingdom, MD PhD [facility] Endocrinology

## 2018-04-20 NOTE — Patient Instructions (Signed)
Please stop at the lab. ? ?Please continue Armour 60 mg alternating with 90 mg every other day. ? ?Take the thyroid hormone every day, with water, at least 30 minutes before breakfast, separated by at least 4 hours from: ?- acid reflux medications ?- calcium ?- iron ?- multivitamins ? ?Please come back for a follow-up appointment in 1 year. ? ?

## 2018-04-30 ENCOUNTER — Other Ambulatory Visit: Payer: Self-pay

## 2018-04-30 MED ORDER — ARMOUR THYROID 60 MG PO TABS: 60.0000 mg | ORAL_TABLET | ORAL | 1 refills | Status: DC

## 2018-05-11 ENCOUNTER — Other Ambulatory Visit: Payer: Self-pay | Admitting: Internal Medicine

## 2018-05-14 IMAGING — CR DG CHEST 2V
2 series · 2 of 2 positions shown · non-contrast
Comparison: None in PACs

CLINICAL DATA: Pre operative examination prior to thyroidectomy.
Current smoker.

EXAM:
CHEST  2 VIEW

[w chest pa]
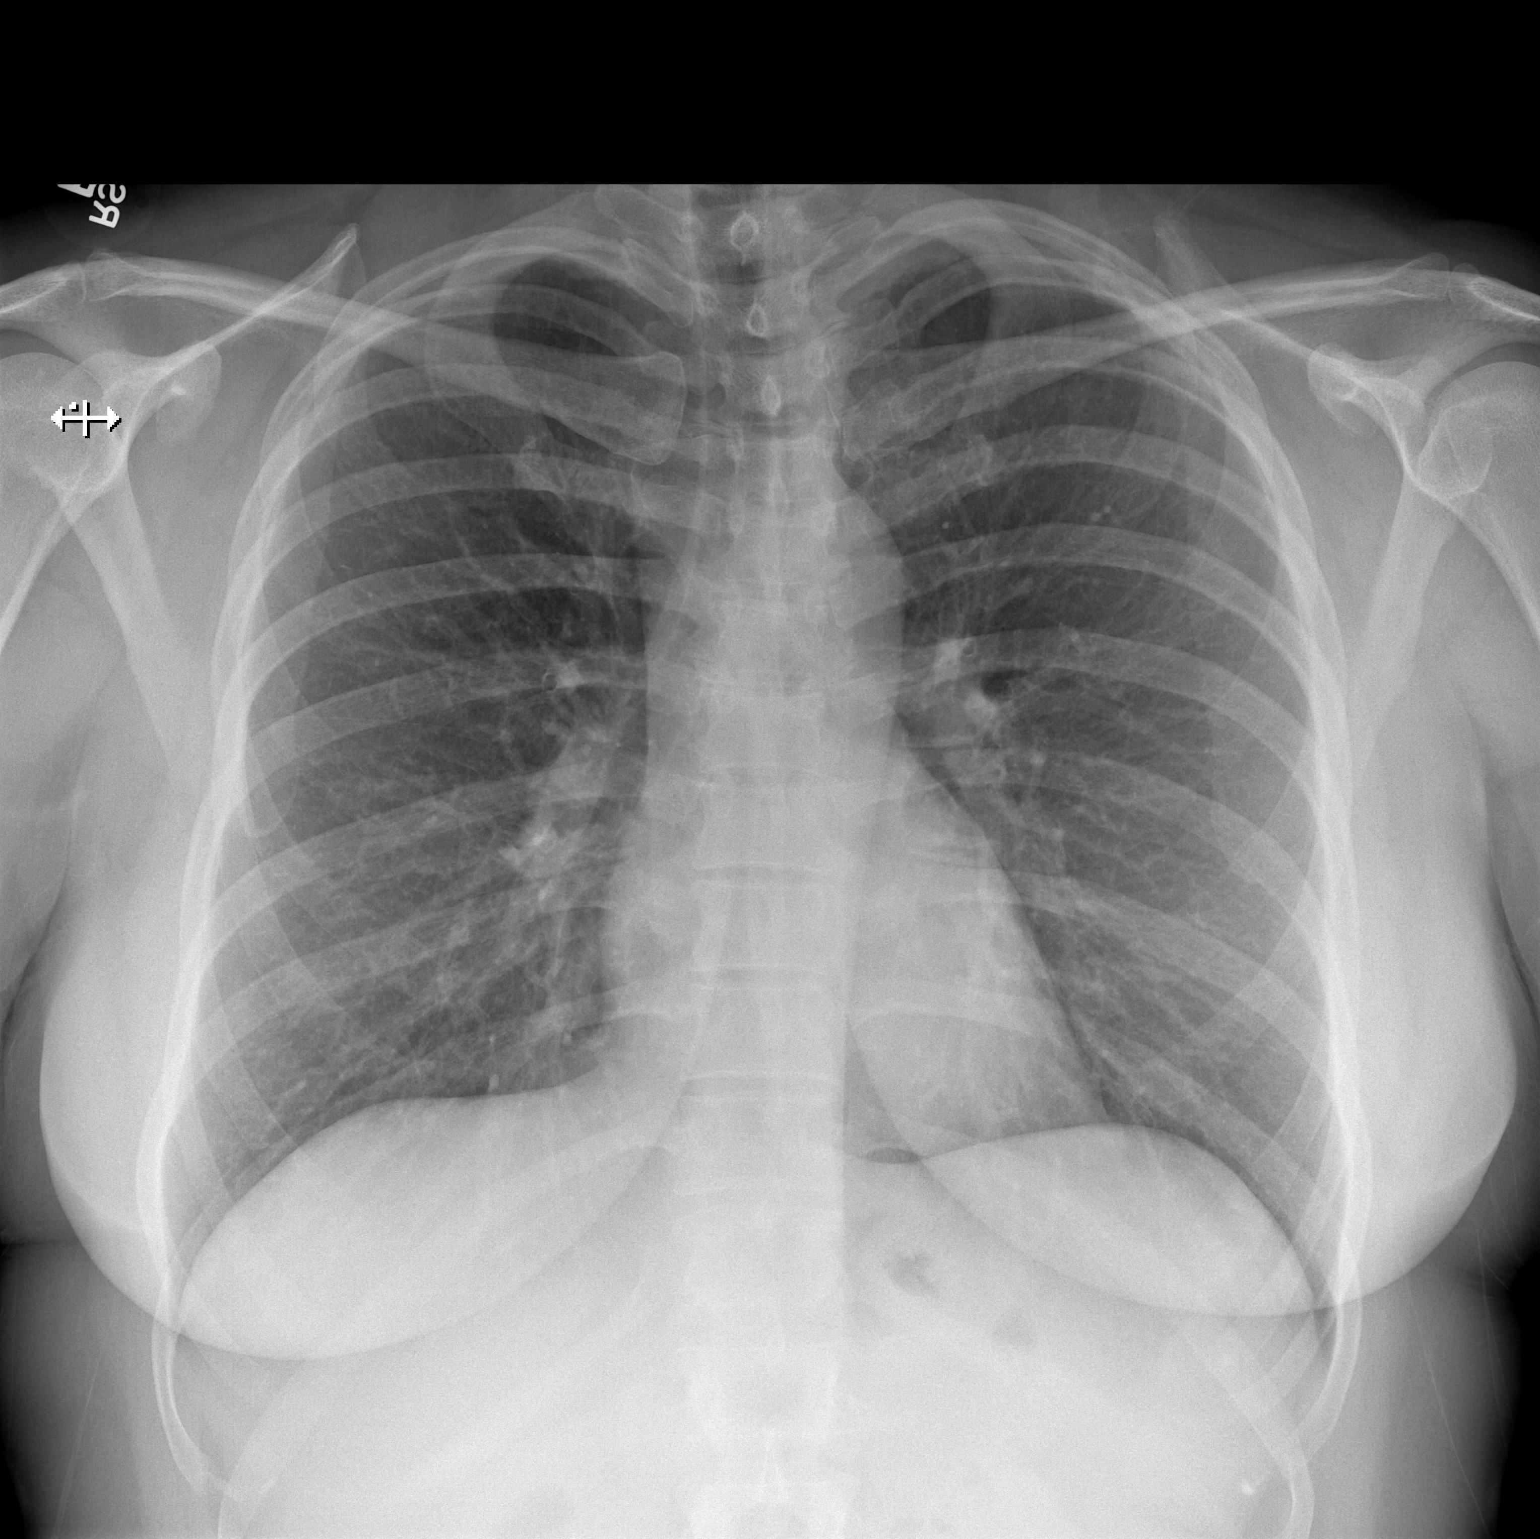

[w chest lat]
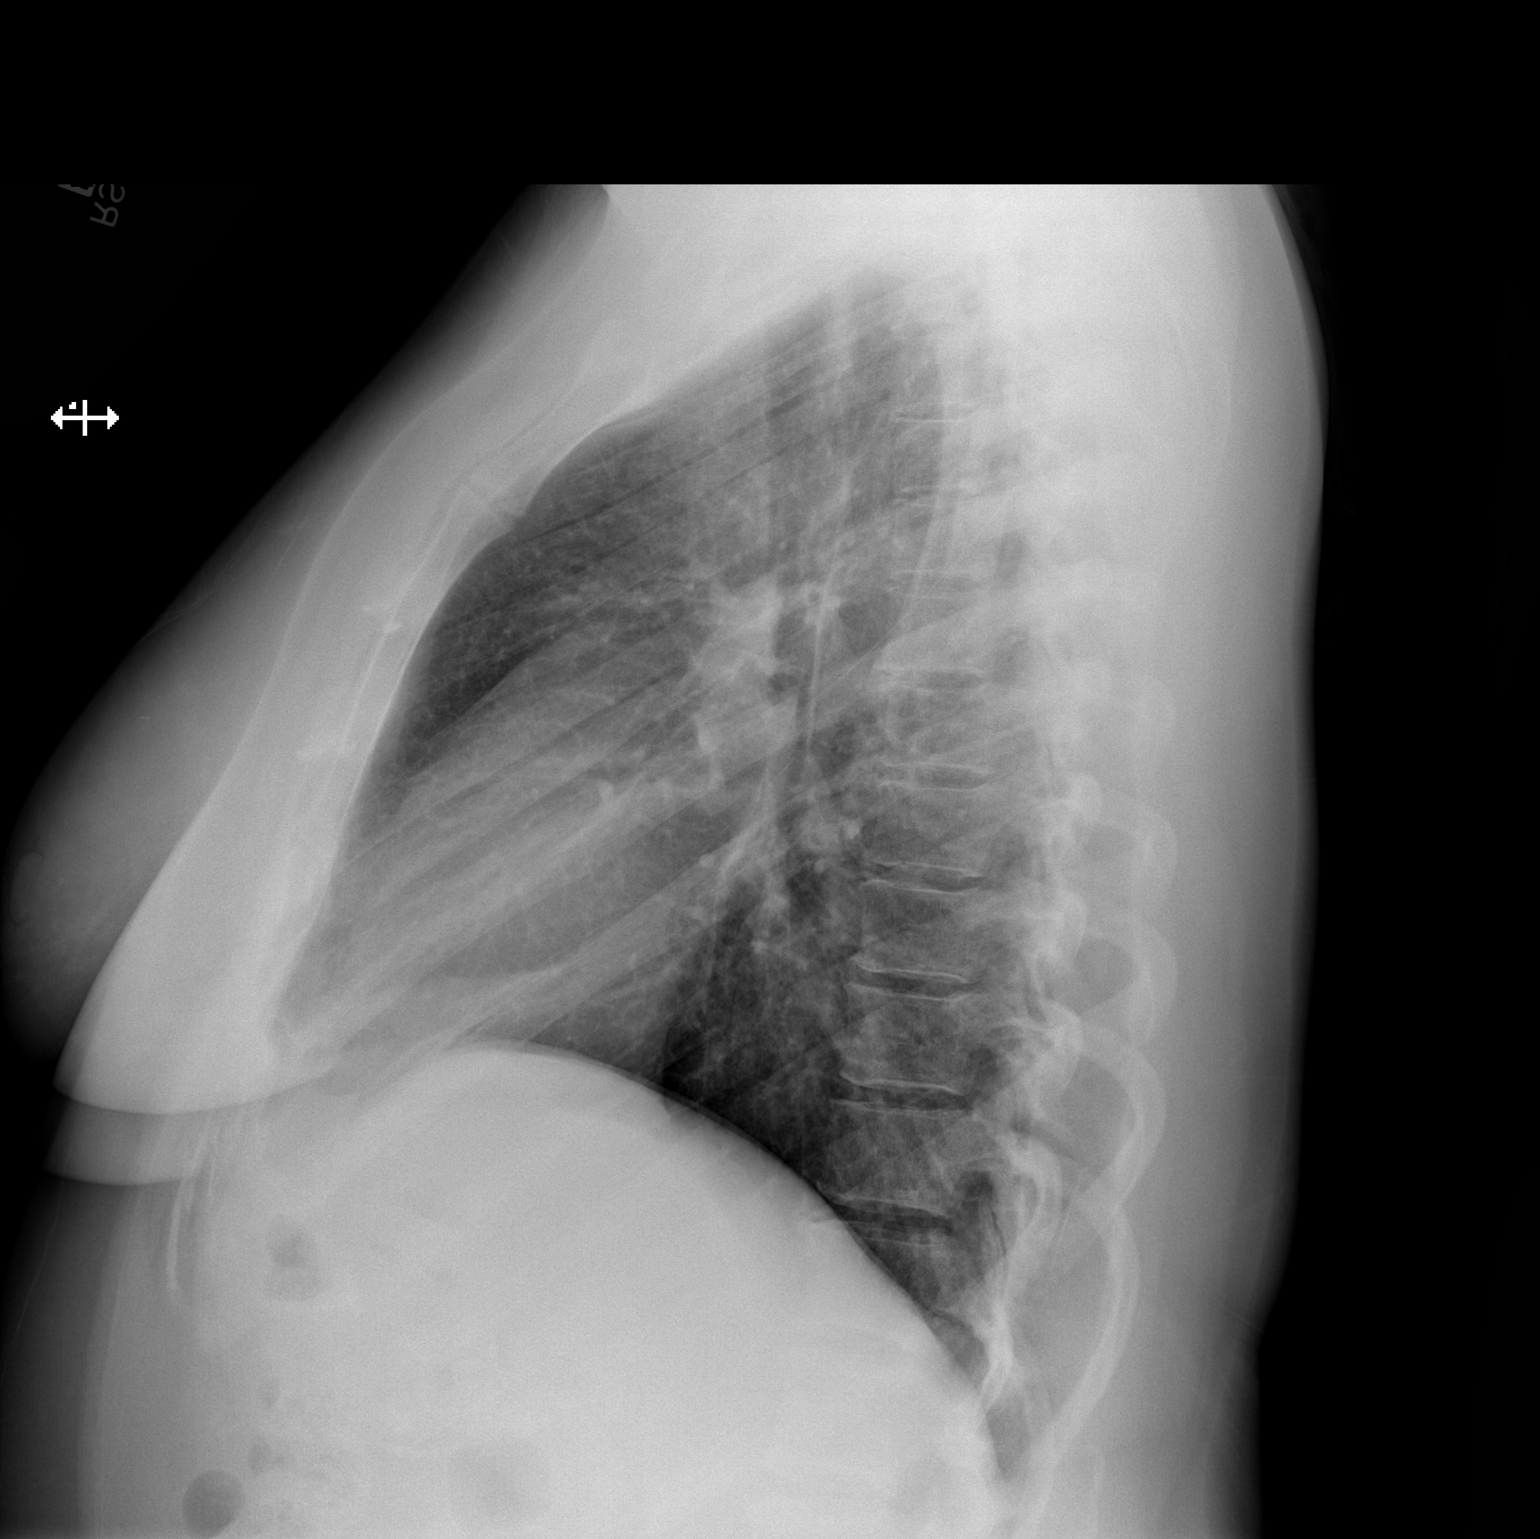

[2 of 2 positions shown; findings below may reference images not displayed]

FINDINGS: The lungs are well-expanded and clear. The heart and pulmonary
vascularity are normal. The mediastinum is normal in width. There is
no pleural effusion. The bony thorax exhibits no acute abnormality.
IMPRESSION: There is no pneumonia, CHF, nor other acute cardiopulmonary
abnormality.

## 2019-02-04 ENCOUNTER — Other Ambulatory Visit: Payer: Self-pay | Admitting: Internal Medicine

## 2019-02-06 ENCOUNTER — Other Ambulatory Visit: Payer: Self-pay | Admitting: Internal Medicine

## 2019-04-17 ENCOUNTER — Telehealth: Payer: Self-pay | Admitting: Internal Medicine

## 2019-04-17 NOTE — Telephone Encounter (Signed)
I need to see her once a year to be able to refill her med - can be a quick (virtual or at least phone) one.

## 2019-04-17 NOTE — Telephone Encounter (Signed)
Patient returned our call in regards to her visit Monday to converting to Virtual. Patient does not feel the appointment is necessary and would like to know if Dr.Gherge would still like to have her levels checked?  Please Advise, Thanks

## 2019-04-22 ENCOUNTER — Other Ambulatory Visit: Payer: Self-pay

## 2019-04-22 ENCOUNTER — Encounter: Payer: Self-pay | Admitting: Internal Medicine

## 2019-04-22 ENCOUNTER — Ambulatory Visit (INDEPENDENT_AMBULATORY_CARE_PROVIDER_SITE_OTHER): Payer: BLUE CROSS/BLUE SHIELD | Admitting: Internal Medicine

## 2019-04-22 DIAGNOSIS — E890 Postprocedural hypothyroidism: Secondary | ICD-10-CM | POA: Diagnosis not present

## 2019-04-22 MED ORDER — ARMOUR THYROID 60 MG PO TABS: ORAL_TABLET | ORAL | 1 refills | Status: DC

## 2019-04-22 MED ORDER — ARMOUR THYROID 90 MG PO TABS: ORAL_TABLET | ORAL | 1 refills | Status: DC

## 2019-04-22 NOTE — Patient Instructions (Signed)
Please come to the lab as soon as safe.  Please continue Armour 60 mg alternating with 90 mg every other day.  Take the thyroid hormone every day, with water, at least 30 minutes before breakfast, separated by at least 4 hours from: - acid reflux medications - calcium - iron - multivitamins  Please come back for a follow-up appointment in 1 year.

## 2019-04-22 NOTE — Progress Notes (Signed)
Patient ID: Kimberly Pennington, female   DOB: 07/24/1971, 48 y.o.   MRN: [id number]  Patient location: Home My location: Office  Referring Provider: Franki Monte, MD  I connected with the patient on 04/22/19 at  4:07 PM EDT by a video enabled telemedicine application and verified that I am speaking with the correct person.   I discussed the limitations of evaluation and management by telemedicine and the availability of in person appointments. The patient expressed understanding and agreed to proceed.   Details of the encounter are shown below.  HPI  Kimberly Pennington is a 47 y.o.-year-old female, presenting for follow-up for postsurgical hypothyroidism. Last visit 1 year ago.  Reviewed history: Pt. has been dx with hypothyroidism in ~2008 (she also had goiter) >> 2008: high thyroid Abx, TSH 112 >> started on Synthroid 100 mcg daily since 2015 >> we then changed to Armour.  She had a thyroid U/S then >> R thyroid nodule (complex) - 2 cm >> Bx: Benign. She had serial U/S's since then >> including U/S: 2014 - no change in size of the nodule.   Repeat Thyroid ultrasound (04/28/2017): 1. Small heterogeneous thyroid with bilateral nodules. 2. Recommend FNA biopsy of 1.5 cm mid right moderately suspicious nodule.  FNA of the right thyroid nodule (63/89/3734):  - FOLLICULAR NEOPLASM OR SUSPICIOUS FOR A FOLLICULAR NEOPLASM (BETHESDA CATEGORY IV)   Total thyroidectomy (06/19/2017) - Dr. Harlow Asa: Benign Thyroid, thyroidectomy, Total Right - FOLLICULAR ADENOMA, 1.4 CM. - LYMPHOCYTIC THYROIDITIS. - ONE BENIGN LYMPH NODE (0/1). - MICROSCOPIC FOCUS OF PARATHYROID TISSUE. - THERE IS NO EVIDENCE OF MALIGNANCY.  She feels good after the surgery, without complaints.  Pt is on Armour 60 alternating with 90 mg every other day, taken: - in am - fasting - at least 30 min from b'fast - no Ca, Fe, MVI, PPIs - not on Biotin  TFTs were normal: Lab Results  Component Value Date   TSH 0.55  04/20/2018   TSH 3.48 01/12/2018   TSH 0.56 07/13/2017   TSH 0.66 04/21/2017   TSH 2.22 10/05/2016   TSH 2.49 07/05/2016   FREET4 0.69 04/20/2018   FREET4 0.70 01/12/2018   FREET4 0.50 (L) 07/13/2017   FREET4 0.61 04/21/2017   FREET4 0.49 (L) 10/05/2016   FREET4 0.96 07/05/2016   T3FREE 4.6 (H) 04/20/2018   T3FREE 3.4 07/13/2017   T3FREE 4.1 10/05/2016   T3FREE 2.5 07/05/2016  05/04/2016: TSH 2.04 2008: TSH 112   She has a history of Hashimoto's thyroiditis Office Visit on 07/05/2016  Component Date Value Ref Range Status  . Thyroperoxidase Ab SerPl-aCnc 07/06/2016 368* <9 IU/mL Final  . Thyroglobulin Ab 07/06/2016 3* <2 IU/mL Final   Pt denies: - feeling nodules in neck - hoarseness - dysphagia - choking - SOB with lying down  She has + FH of thyroid disorders in: sister. No FH of thyroid cancer. No h/o radiation tx to head or neck.  No seaweed or kelp. No recent contrast studies. No herbal supplements. No Biotin use. No recent steroids use.   Before last visit, she intentionally lost 15 pounds.  ROS: Constitutional: no weight gain/no weight loss, no fatigue, no subjective hyperthermia, no subjective hypothermia Eyes: no blurry vision, no xerophthalmia ENT: no sore throat, + see HPI Cardiovascular: no CP/no SOB/no palpitations/no leg swelling Respiratory: no cough/no SOB/no wheezing Gastrointestinal: no N/no V/no D/no C/no acid reflux Musculoskeletal: no muscle aches/no joint aches Skin: no rashes, no hair loss Neurological: no tremors/no numbness/no tingling/no dizziness  I reviewed pt's medications, allergies, PMH, social hx, family hx, and changes were documented in the history of present illness. Otherwise, unchanged from my initial visit note. PMH: Patient Active Problem List   Diagnosis Date Noted  . 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 core biopsy     R breast- benign   .  THYROIDECTOMY N/A 06/19/2017   Procedure: TOTAL THYROIDECTOMY;  Surgeon: Armandina Gemma, MD;  Location: [facility];  Service: General;  Laterality: N/A;  . TUBAL LIGATION  2000  . VAGINAL DELIVERY     x3- with epidurals  . WISDOM TOOTH EXTRACTION     2 done at a time, in regular dentist office     Social History   Social History  . Marital Status: Married    Spouse Name: N/A  . Number of Children: 3   Occupational History  . Customer service rep    Social History Main Topics  . Smoking status: Yes, Vape  . Alcohol Use: Beer or wine seldom   . Drug Use: No   Current Outpatient Rx  Name  Route  Sig  Dispense  Refill  . Ergocalciferol (VITAMIN D2) 2000 units TABS   Oral   Take by mouth.         . Multiple Vitamin (MULTI-VITAMIN DAILY PO)   Oral   Take by mouth.         . SYNTHROID 100 MCG tablet      take 1 tablet by mouth daily      1     Dispense as written.    Allergies  Allergen Reactions  . No Known Allergies      Family History  Problem Relation Age of Onset  . Heart disease Father   . Hyperlipidemia Father   . Hypertension Father   + see HPI  PE: There were no vitals taken for this visit. Wt Readings from Last 3 Encounters:  04/20/18 197 lb 3.2 oz (89.4 kg)  01/12/18 212 lb 6.4 oz (96.3 kg)  07/13/17 222 lb (100.7 kg)   Constitutional:  in NAD  The physical exam was not performed (virtual visit).  ASSESSMENT: 1. Hypothyroidism - post surgical  2. h/o R Thyroid nodule  Adequacy Reason Satisfactory For Evaluation. Diagnosis THYROID, FINE NEEDLE ASPIRATION, RIGHT RMP (SPECIMEN 1 OF 1, COLLECTED 09/15/18): - FOLLICULAR NEOPLASM OR SUSPICIOUS FOR A FOLLICULAR NEOPLASM (BETHESDA CATEGORY IV) - SEE COMMENT DAWN BUTLER MD Pathologist, Electronic Signature (Case signed 05/11/2017) Specimen Clinical Information Nodule 1 Right Mid 1.5 cm; Other 2 dimensions 0.8 x 1cm solid / almost completely solid, Isoechoic, ACR TI-RADS total points: 5,  Moderatley suspicious nodule Source Thyroid, Fine Needle Aspiration, Rt RMP, (Specimen 1 of 1, collected on 05/10/17 )  Risk of malignancy is 15-30% for this results (per Bethesda classification) >> recommendation is for lobectomy.  06/19/2017: Total thyroidectomy by Dr. Harlow Asa  PLAN:  1. And 2.  Patient with history of Hashimoto's thyroiditis and thyroid nodules, now status post total thyroidectomy for an indeterminate thyroid nodule, biopsied 03/2017.  Her total thyroidectomy was in 05/2017 by Dr. Harlow Asa.  Final pathology was benign.  We started Armour Thyroid after the surgery. -She is alternating 60 mg with 90 mg of Armour every other day. - latest thyroid labs reviewed with pt >> normal a year ago - pt feels good on this dose. - we discussed about taking the thyroid hormone every day,  with water, >30 minutes before breakfast, separated by >4 hours from acid reflux medications, calcium, iron, multivitamins. Pt. is taking it correctly. - will check thyroid tests when safe to return to the clinic: TSH, free T3 and fT4  - If labs are abnormal, she will need to return for repeat TFTs in 1.5 months - Otherwise, I will see her back in a year  3. Fatigue  - in the pm -She is wondering whether this could be related to her hypothyroidism.  This is possible, however, she denies other hypothyroid symptoms. -We will definitely need to check her TFTs for now, but will wait until safe to bring her to the clinic (coronavirus pandemic).  Orders Placed This Encounter  Procedures  . TSH  . T4, free  . T3, free   Philemon Kingdom, MD PhD [facility]

## 2019-10-31 ENCOUNTER — Other Ambulatory Visit: Payer: Self-pay | Admitting: Internal Medicine

## 2020-05-09 ENCOUNTER — Other Ambulatory Visit: Payer: Self-pay | Admitting: Internal Medicine

## 2020-05-11 ENCOUNTER — Other Ambulatory Visit: Payer: Self-pay | Admitting: Internal Medicine

## 2020-06-07 ENCOUNTER — Other Ambulatory Visit: Payer: Self-pay | Admitting: Internal Medicine

## 2020-06-29 ENCOUNTER — Other Ambulatory Visit: Payer: Self-pay | Admitting: Internal Medicine

## 2020-08-02 ENCOUNTER — Other Ambulatory Visit: Payer: Self-pay | Admitting: Internal Medicine

## 2020-10-06 ENCOUNTER — Other Ambulatory Visit: Payer: Self-pay | Admitting: Internal Medicine

## 2020-12-20 ENCOUNTER — Other Ambulatory Visit: Payer: Self-pay | Admitting: Internal Medicine

## 2021-03-24 ENCOUNTER — Other Ambulatory Visit: Payer: Self-pay | Admitting: Internal Medicine

## 2021-03-31 ENCOUNTER — Telehealth: Payer: Self-pay | Admitting: Internal Medicine

## 2021-03-31 NOTE — Telephone Encounter (Signed)
Pt has not been seen since 2020. Called and advised pt needs appt scheduled for further refills. Pt requested to come in tomorrow you have an opening at 8:40 am. Ok to schedule?

## 2021-03-31 NOTE — Telephone Encounter (Signed)
New message   .   1. Which medications need to be refilled? (please list name of each medication and dose if known) ARMOUR THYROID 90 MG tablet  2. Which pharmacy/location (including street and city if local pharmacy) is medication to be sent to?  [place], [place] - [address] AT [address] OF [address] ([address])

## 2021-03-31 NOTE — Telephone Encounter (Signed)
Kimberly Pennington, i am really not comfortable with more than 10 pts per half a day... but in this case, as the 8:20 pt may not come, we can leave her on the list. C

## 2021-04-01 ENCOUNTER — Other Ambulatory Visit: Payer: Self-pay

## 2021-04-01 ENCOUNTER — Encounter: Payer: Self-pay | Admitting: Internal Medicine

## 2021-04-01 ENCOUNTER — Ambulatory Visit: Payer: Managed Care, Other (non HMO) | Admitting: Internal Medicine

## 2021-04-01 VITALS — BP 118/60 | HR 64 | Ht 67.0 in | Wt 225.0 lb

## 2021-04-01 DIAGNOSIS — E890 Postprocedural hypothyroidism: Secondary | ICD-10-CM

## 2021-04-01 LAB — T4, FREE: Free T4: 0.62 ng/dL (ref 0.60–1.60)

## 2021-04-01 LAB — TSH: TSH: 2.02 u[IU]/mL (ref 0.35–4.50)

## 2021-04-01 LAB — T3, FREE: T3, Free: 4.3 pg/mL — ABNORMAL HIGH (ref 2.3–4.2)

## 2021-04-01 MED ORDER — ARMOUR THYROID 90 MG PO TABS: ORAL_TABLET | ORAL | 1 refills | Status: DC

## 2021-04-01 NOTE — Patient Instructions (Signed)
Please stop at the lab.  Please continue Armour 60 mg alternating with 90 mg every other day.  Take the thyroid hormone every day, with water, at least 30 minutes before breakfast, separated by at least 4 hours from: - acid reflux medications - calcium - iron - multivitamins  Please come back for a follow-up appointment in 1 year.

## 2021-04-01 NOTE — Progress Notes (Signed)
Patient ID: Kimberly Pennington, female   DOB: December 13, 1971, 50 y.o.   MRN: [id number]  This visit occurred during the SARS-CoV-2 public health emergency.  Safety protocols were in place, including screening questions prior to the visit, additional usage of staff PPE, and extensive cleaning of exam room while observing appropriate contact time as indicated for disinfecting solutions.   HPI  Kimberly Pennington is a 50 y.o.-year-old female, presenting for follow-up for postsurgical hypothyroidism. Last visit 2 years ago.  Interim history: She was dx'ed with RA in 10/2020. Now on Plaquenil - she feels this is helping, but still having generalized joint pains. She still has fatigue, hot flushes - not so bothersome at night.  She also experienced weight gain since last visit (almost 30 pounds). She is on her feet at work  - changed jobs, now works in [employer] (1 hour away).  Reviewed history: Pt. has been dx with hypothyroidism in ~2008 (she also had goiter) >> 2008: high thyroid Abx, TSH 112 >> started on Synthroid 100 mcg daily since 2015 >> we then changed to Armour.  She had a thyroid U/S then >> R thyroid nodule (complex) - 2 cm >> Bx: Benign. She had serial U/S's since then >> including U/S: 2014 - no change in size of the nodule.   Repeat Thyroid ultrasound (04/28/2017): 1. Small heterogeneous thyroid with bilateral nodules. 2. Recommend FNA biopsy of 1.5 cm mid right moderately suspicious nodule.  FNA of the right thyroid nodule (40/98/1191):  - FOLLICULAR NEOPLASM OR SUSPICIOUS FOR A FOLLICULAR NEOPLASM (BETHESDA CATEGORY IV)   Total thyroidectomy (06/19/2017) - Dr. Harlow Asa: Benign Thyroid, thyroidectomy, Total Right - FOLLICULAR ADENOMA, 1.4 CM. - LYMPHOCYTIC THYROIDITIS. - ONE BENIGN LYMPH NODE (0/1). - MICROSCOPIC FOCUS OF PARATHYROID TISSUE. - THERE IS NO EVIDENCE OF MALIGNANCY.  She feels good after the surgery, without complaints.  Pt is on Armour 60 alternating with 90 mg every  other day, taken: - in am - fasting - at least 30 min from b'fast - no Ca, Fe, MVI, PPIs - not on Biotin  TFTs were normal: 11/11/2020: TSH 2.82 (per rheumatology) Lab Results  Component Value Date   TSH 0.55 04/20/2018   TSH 3.48 01/12/2018   TSH 0.56 07/13/2017   TSH 0.66 04/21/2017   TSH 2.22 10/05/2016   TSH 2.49 07/05/2016   FREET4 0.69 04/20/2018   FREET4 0.70 01/12/2018   FREET4 0.50 (L) 07/13/2017   FREET4 0.61 04/21/2017   FREET4 0.49 (L) 10/05/2016   FREET4 0.96 07/05/2016   T3FREE 4.6 (H) 04/20/2018   T3FREE 3.4 07/13/2017   T3FREE 4.1 10/05/2016   T3FREE 2.5 07/05/2016  05/04/2016: TSH 2.04 2008: TSH 112   She has a history of Hashimoto's thyroiditis: Office Visit on 07/05/2016  Component Date Value Ref Range Status  . Thyroperoxidase Ab SerPl-aCnc 07/06/2016 368* <9 IU/mL Final  . Thyroglobulin Ab 07/06/2016 3* <2 IU/mL Final   Pt denies: - feeling nodules in neck - hoarseness - dysphagia - choking - SOB with lying down  She has + FH of thyroid disorders in: sister. No FH of thyroid cancer. No h/o radiation tx to head or neck.  No seaweed or kelp. No recent contrast studies. No herbal supplements. No Biotin use. No recent steroids use.   ROS: + See HPI Constitutional: + weight gain/no weight loss, no fatigue, + subjective hyperthermia, no subjective hypothermia Eyes: no blurry vision, no xerophthalmia ENT: no sore throat, + see HPI Cardiovascular: no CP/no SOB/no palpitations/no leg  swelling Respiratory: no cough/no SOB/no wheezing Gastrointestinal: no N/no V/no D/no C/no acid reflux Musculoskeletal: no muscle aches/+ joint aches Skin: no rashes, no hair loss Neurological: no tremors/no numbness/no tingling/no dizziness  I reviewed pt's medications, allergies, PMH, social hx, family hx, and changes were documented in the history of present illness. Otherwise, unchanged from my initial visit note.  PMH: Patient Active Problem List   Diagnosis  Date Noted  . Postsurgical hypothyroidism 07/05/2016    Priority: High   Past Surgical History:  Procedure Laterality Date  . core biopsy     R breast- benign   . THYROIDECTOMY N/A 06/19/2017   Procedure: TOTAL THYROIDECTOMY;  Surgeon: Armandina Gemma, MD;  Location: [facility];  Service: General;  Laterality: N/A;  . TUBAL LIGATION  2000  . VAGINAL DELIVERY     x3- with epidurals  . WISDOM TOOTH EXTRACTION     2 done at a time, in regular dentist office     Social History   Social History  . Marital Status: Married    Spouse Name: N/A  . Number of Children: 3   Occupational History  . Customer service rep    Social History Main Topics  . Smoking status: Yes, Vape  . Alcohol Use: Beer or wine seldom   . Drug Use: No   Current Outpatient Medications  Medication Sig Dispense Refill  . ARMOUR THYROID 60 MG tablet TAKE 1 TABLET BY MOUTH EVERY OTHER DAY, ALTERNATE EVERY OTHER DAY WITH 90MG  ARMOUR THYROID 90 tablet 0  . ARMOUR THYROID 90 MG tablet TAKE 1 TABLET BY MOUTH EVERY OTHER DAY. MUST SEE DOCTOR FOR REFILL. 45 tablet 0   No current facility-administered medications for this visit.   Allergies  Allergen Reactions  . No Known Allergies     Family History  Problem Relation Age of Onset  . Heart disease Father   . Hyperlipidemia Father   . Hypertension Father   + see HPI  PE: BP 118/60   Pulse 64   Ht 5\' 7"  (1.702 m)   Wt 225 lb (102.1 kg)   SpO2 94%   BMI 35.24 kg/m  Wt Readings from Last 3 Encounters:  04/01/21 225 lb (102.1 kg)  04/20/18 197 lb 3.2 oz (89.4 kg)  01/12/18 212 lb 6.4 oz (96.3 kg)   Constitutional: overweight, in NAD Eyes: PERRLA, EOMI, no exophthalmos ENT: moist mucous membranes, no neck masses, thyroidectomy scar healed, no cervical lymphadenopathy Cardiovascular: RRR, No MRG Respiratory: CTA B Gastrointestinal: abdomen soft, NT, ND, BS+ Musculoskeletal: no deformities, strength intact in all 4 Skin: moist, warm, no rashes Neurological: no  tremor with outstretched hands, DTR normal in all 4  ASSESSMENT: 1. Hypothyroidism - post surgical -h/o R Thyroid nodule, now status post thyroidectomy  Adequacy Reason Satisfactory For Evaluation. Diagnosis THYROID, FINE NEEDLE ASPIRATION, RIGHT RMP (SPECIMEN 1 OF 1, COLLECTED 3/57/01): - FOLLICULAR NEOPLASM OR SUSPICIOUS FOR A FOLLICULAR NEOPLASM (BETHESDA CATEGORY IV) - SEE COMMENT DAWN BUTLER MD Pathologist, Electronic Signature (Case signed 05/11/2017) Specimen Clinical Information Nodule 1 Right Mid 1.5 cm; Other 2 dimensions 0.8 x 1cm solid / almost completely solid, Isoechoic, ACR TI-RADS total points: 5, Moderatley suspicious nodule Source Thyroid, Fine Needle Aspiration, Rt RMP, (Specimen 1 of 1, collected on 05/10/17 )  Risk of malignancy is 15-30% for this results (per Bethesda classification) >> recommendation is for lobectomy.  06/19/2017: Total thyroidectomy by Dr. Harlow Asa  PLAN:  1. Patient with history of Hashimoto's thyroiditis and thyroid nodules, now status  post total thyroidectomy in 05/2017 (Dr. Harlow Asa) for an indeterminate thyroid nodule.  Final pathology was benign.  We started Armour Thyroid after the surgery. -She is currently on Armour 60 alternating with 90 mg every other day - pt complains of fatigue and weight gain.  She would be interested in increasing the dose if possible. - latest thyroid labs reviewed with pt >> normal in 10/2020 - we discussed about taking the thyroid hormone every day, with water, >30 minutes before breakfast, separated by >4 hours from acid reflux medications, calcium, iron, multivitamins. Pt. is taking it correctly. - will check thyroid tests today: TSH, free T3 and fT4 - If labs are abnormal, she will need to return for repeat TFTs in 1.5 months - OTW, I will see her back in a year  Needs refills for the 90 mg tab.  Component     Latest Ref Rng & Units 04/01/2021  T4,Free(Direct)     0.60 - 1.60 ng/dL 0.62   Triiodothyronine,Free,Serum     2.3 - 4.2 pg/mL 4.3 (H)  TSH     0.35 - 4.50 uIU/mL 2.02   TFTs at goal.  I would not suggest an increase in Armour dose for now.  Philemon Kingdom, MD PhD [facility]

## 2021-04-01 NOTE — Telephone Encounter (Signed)
Absolutely noted. Thank you

## 2021-06-21 ENCOUNTER — Encounter: Payer: Self-pay | Admitting: Internal Medicine

## 2021-06-23 ENCOUNTER — Other Ambulatory Visit: Payer: Self-pay

## 2021-06-23 MED ORDER — ARMOUR THYROID 60 MG PO TABS: ORAL_TABLET | ORAL | 0 refills | Status: DC

## 2021-06-23 MED ORDER — ARMOUR THYROID 90 MG PO TABS: ORAL_TABLET | ORAL | 1 refills | Status: DC

## 2021-06-23 NOTE — Telephone Encounter (Signed)
MEDICATION:  ARMOUR THYROID 60 MG tablet  PHARMACY:   [facility], [place] - [address] AT [facility] OF [address] ([address]) Phone:  [phone]  Fax:  [phone]      HAS THE PATIENT CONTACTED THEIR PHARMACY?  Yes-No  Refills on file  IS THIS A 90 DAY SUPPLY : Yes-prefers  IS PATIENT OUT OF MEDICATION: No  IF NOT; HOW MUCH IS LEFT: Approx. 5 days  LAST APPOINTMENT DATE: @4 /06/2021  NEXT APPOINTMENT DATE:@4 /06/2022  DO WE HAVE YOUR PERMISSION TO LEAVE A DETAILED MESSAGE?: Yes  OTHER COMMENTS:    **Let patient know to contact pharmacy at the end of the day to make sure medication is ready. **  ** Please notify patient to allow 48-72 hours to process**  **Encourage patient to contact the pharmacy for refills or they can request refills through [facility]**

## 2021-09-27 ENCOUNTER — Encounter: Payer: Self-pay | Admitting: Internal Medicine

## 2021-09-29 ENCOUNTER — Other Ambulatory Visit (HOSPITAL_COMMUNITY): Payer: Self-pay

## 2021-09-29 ENCOUNTER — Telehealth: Payer: Self-pay | Admitting: Pharmacy Technician

## 2021-09-29 NOTE — Telephone Encounter (Signed)
Patient Advocate Encounter   Received notification from [employer] that prior authorization for ARMOUR THYROID 60MG  & 90MG  is required.   PA submitted on 09/29/21 Key [id number] (60MG ) Key [id number] (90MG ) Status is pending    [facility] will continue to follow   Burney Gauze, CPhT Patient Advocate  Phone: [phone] Fax:  [phone]

## 2021-09-29 NOTE — Telephone Encounter (Signed)
Yes, we will submit PA for both 90mg  and 60mg  today. Will update accordingly.

## 2021-10-12 NOTE — Telephone Encounter (Signed)
Received notification from [place] regarding a prior authorization for ARMOUR THYROID 60MG . Authorization has been APPROVED from 10.05.22 to 11.7.22.    Authorization # CaseId: [id number]

## 2021-12-11 ENCOUNTER — Other Ambulatory Visit: Payer: Self-pay | Admitting: Internal Medicine

## 2021-12-11 DIAGNOSIS — E890 Postprocedural hypothyroidism: Secondary | ICD-10-CM

## 2021-12-13 MED ORDER — ARMOUR THYROID 90 MG PO TABS: ORAL_TABLET | ORAL | 1 refills | Status: DC

## 2022-04-01 ENCOUNTER — Ambulatory Visit: Payer: Managed Care, Other (non HMO) | Admitting: Internal Medicine

## 2022-04-08 ENCOUNTER — Encounter: Payer: Self-pay | Admitting: Internal Medicine

## 2022-04-08 ENCOUNTER — Ambulatory Visit: Payer: 59 | Admitting: Internal Medicine

## 2022-04-08 ENCOUNTER — Other Ambulatory Visit: Payer: Self-pay | Admitting: Internal Medicine

## 2022-04-08 VITALS — BP 130/80 | HR 79 | Ht 67.0 in | Wt 186.2 lb

## 2022-04-08 DIAGNOSIS — E890 Postprocedural hypothyroidism: Secondary | ICD-10-CM | POA: Diagnosis not present

## 2022-04-08 LAB — T4, FREE: Free T4: 0.44 ng/dL — ABNORMAL LOW (ref 0.60–1.60)

## 2022-04-08 LAB — T3, FREE: T3, Free: 2.6 pg/mL (ref 2.3–4.2)

## 2022-04-08 LAB — TSH: TSH: 30.67 u[IU]/mL — ABNORMAL HIGH (ref 0.35–5.50)

## 2022-04-08 NOTE — Progress Notes (Signed)
Patient ID: Kimberly Pennington, female   DOB: 06-Jun-1971, 51 y.o.   MRN: [id number] ? ?This visit occurred during the SARS-CoV-2 public health emergency.  Safety protocols were in place, including screening questions prior to the visit, additional usage of staff PPE, and extensive cleaning of exam room while observing appropriate contact time as indicated for disinfecting solutions.  ? ?HPI  ?Kimberly Pennington is a 51 y.o.-year-old female, presenting for follow-up for postsurgical hypothyroidism. Last visit 1 year ago. ? ?Interim history: ?She was dx'ed with RA in 10/2020.  On methotrexate increased dose) along with Plaquenil.  Since last visit, she started on a high protein diet. Also sees a weight loss clinic >> on hCG injections. Lost 40 lbs!!! ?Before last visit, she started to have more fatigue, hot flashes and weight gain (almost 30 pounds since the previous visit). ?At today's visit, she still has fatigue, which is considered to be associated with her RA.  Also, she is keeping her grandchildren (a set of twins - 37 years old and a granddaughter - 41 years old). ?She has viral laryngitis.  ? ?Reviewed history: ?Pt. has been dx with hypothyroidism in ~2008 (she also had goiter) >> 2008: high thyroid Abx, TSH 112 >> started on Synthroid 100 mcg daily since 2015 >> we then changed to Armour. ? ?She had a thyroid U/S then >> R thyroid nodule (complex) - 2 cm >> Bx: Benign. She had serial U/S's since then >> including U/S: 2014 - no change in size of the nodule. ? ? Repeat Thyroid ultrasound (04/28/2017): ?1. Small heterogeneous thyroid with bilateral nodules. ?2. Recommend FNA biopsy of 1.5 cm mid right moderately suspicious nodule. ? ?FNA of the right thyroid nodule (40/98/1191): ? - FOLLICULAR NEOPLASM OR SUSPICIOUS FOR A FOLLICULAR NEOPLASM ([facility]ESDA CATEGORY IV) ? ? Total thyroidectomy (06/19/2017) - Dr. Harlow Asa: Benign ?Thyroid, thyroidectomy, Total Right ?- FOLLICULAR ADENOMA, 1.4 CM. ?- LYMPHOCYTIC  THYROIDITIS. ?- ONE BENIGN LYMPH NODE (0/1). ?- MICROSCOPIC FOCUS OF PARATHYROID TISSUE. ?- THERE IS NO EVIDENCE OF MALIGNANCY. ? ?Pt is on Armour 60 alternating with 90 mg every other day, taken: ?- in am ?- fasting ?- at least 30 min from b'fast ?- no Ca, Fe, MVI, PPIs ?- not on Biotin ? ?TFTs were normal: ?Lab Results  ?Component Value Date  ? TSH 2.02 04/01/2021  ? TSH 0.55 04/20/2018  ? TSH 3.48 01/12/2018  ? TSH 0.56 07/13/2017  ? TSH 0.66 04/21/2017  ? TSH 2.22 10/05/2016  ? TSH 2.49 07/05/2016  ? FREET4 0.62 04/01/2021  ? FREET4 0.69 04/20/2018  ? FREET4 0.70 01/12/2018  ? FREET4 0.50 (L) 07/13/2017  ? FREET4 0.61 04/21/2017  ? FREET4 0.49 (L) 10/05/2016  ? FREET4 0.96 07/05/2016  ? T3FREE 4.3 (H) 04/01/2021  ? T3FREE 4.6 (H) 04/20/2018  ? T3FREE 3.4 07/13/2017  ? T3FREE 4.1 10/05/2016  ? T3FREE 2.5 07/05/2016  ?11/11/2020: TSH 2.82 (per rheumatology) ?05/04/2016: TSH 2.04 ?2008: TSH 112  ? ?She has a history of Hashimoto's thyroiditis: ?Office Visit on 07/05/2016  ?Component Date Value Ref Range Status  ? Thyroperoxidase Ab SerPl-aCnc 07/06/2016 368* <9 IU/mL Final  ? Thyroglobulin Ab 07/06/2016 3* <2 IU/mL Final  ? ?Pt denies: ?- feeling nodules in neck ?- hoarseness ?- dysphagia ?- choking ?- SOB with lying down ? ?She has + FH of thyroid disorders in: sister. No FH of thyroid cancer. No h/o radiation tx to head or neck. ?No herbal supplements. No Biotin use. No recent steroids use.  On  liquid B12. ? ?ROS: + See HPI  ?+ subjective hyperthermia  - persistent ? ?I reviewed pt's medications, allergies, PMH, social hx, family hx, and changes were documented in the history of present illness. Otherwise, unchanged from my initial visit note. ? ?PMH: ?Patient Active Problem List  ? Diagnosis Date Noted  ? Postsurgical hypothyroidism 07/05/2016  ?  Priority: High  ? ?Past Surgical History:  ?Procedure Laterality Date  ? core biopsy    ? R breast- benign   ? THYROIDECTOMY N/A 06/19/2017  ? Procedure: TOTAL  THYROIDECTOMY;  Surgeon: Armandina Gemma, MD;  Location: [facility];  Service: General;  Laterality: N/A;  ? TUBAL LIGATION  2000  ? VAGINAL DELIVERY    ? x3- with epidurals  ? WISDOM TOOTH EXTRACTION    ? 2 done at a time, in regular dentist office   ?  ?Social History  ? ?Social History  ? Marital Status: Married  ?  Spouse Name: N/A  ? Number of Children: 3  ? 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 ?Social History Main Topics  ? Smoking status: Yes, Vape  ? Alcohol Use: Beer or wine seldom   ? Drug Use: No  ? ?Current Outpatient Medications  ?Medication Sig Dispense Refill  ? ARMOUR THYROID 60 MG tablet TAKE 1 TABLET BY MOUTH EVERY OTHER DAY AND ALTERNATE EVERY OTHER DAY WITH 90 MG ARMOUR THYROID 45 tablet 1  ? ARMOUR THYROID 90 MG tablet TAKE 1 TABLET BY MOUTH EVERY OTHER DAY AND ALTERNATE EVERY OTHER DAY WITH 60 MG ARMOUR THYROID 45 tablet 1  ? ?No current facility-administered medications for this visit.  ? ?Allergies  ?Allergen Reactions  ? No Known Allergies   ?  ?Family History  ?Problem Relation Age of Onset  ? Heart disease Father   ? Hyperlipidemia Father   ? Hypertension Father   ?+ see HPI ? ?PE: ?BP 130/80 (BP Location: Left Arm, Patient Position: Sitting, Cuff Size: Normal)   Pulse 79   Ht '5\' 7"'$  (1.702 m)   Wt 186 lb 3.2 oz (84.5 kg)   SpO2 99%   BMI 29.16 kg/m?  ?Wt Readings from Last 3 Encounters:  ?04/08/22 186 lb 3.2 oz (84.5 kg)  ?04/01/21 225 lb (102.1 kg)  ?04/20/18 197 lb 3.2 oz (89.4 kg)  ? ?Constitutional: overweight, in NAD ?Eyes: PERRLA, EOMI, no exophthalmos ?ENT: moist mucous membranes, no neck masses, thyroidectomy scar healed, no cervical lymphadenopathy ?Cardiovascular: RRR, No MRG ?Respiratory: CTA B ?Musculoskeletal: no deformities, strength intact in all 4 ?Skin: moist, warm, no rashes ?Neurological: no tremor with outstretched hands, DTR normal in all 4 ? ?ASSESSMENT: ?1. Hypothyroidism - post surgical ?-h/o R Thyroid nodule, now status post thyroidectomy ? ?Adequacy  Reason ?Satisfactory For Evaluation. ?Diagnosis ?THYROID, FINE NEEDLE ASPIRATION, ?RIGHT RMP (SPECIMEN 1 OF 1, COLLECTED 9/35/70): ?- FOLLICULAR NEOPLASM OR SUSPICIOUS FOR A FOLLICULAR NEOPLASM (BETHESDA CATEGORY IV) ?- SEE COMMENT ?Thressa Sheller MD ?Pathologist, Electronic Signature ?(Case signed 05/11/2017) ?Specimen Clinical Information ?Nodule 1 Right Mid 1.5 cm; Other 2 dimensions 0.8 x 1cm solid / almost completely solid, Isoechoic, ACR TI-RADS total ?points: 5, Moderatley suspicious nodule ?Source ?Thyroid, Fine Needle Aspiration, Rt RMP, (Specimen 1 of 1, collected on 05/10/17 ) ? ?Risk of malignancy is 15-30% for this results (per [facility]hesda classification) >> recommendation is for lobectomy. ? ?06/19/2017: Total thyroidectomy by Dr. Harlow Asa ? ?PLAN:  ?1. Patient with history of Hashimoto's thyroiditis and thyroid nodules, now status post thyroidectomy in 05/2017 by  Dr. Harlow Asa for an indeterminate thyroid nodule.  Final pathology was benign.  We started Armour Thyroid after the surgery. ?-She is currently alternating Armour 60 mg with 90 mg every other day.  A PA for both doses was approved by her insurance. ?-At last visit, she was complaining of fatigue and weight gain and was interested in increasing the dose if possible.  However, TFTs were normal and we ended up continuing the same dose ?- latest thyroid labs reviewed with pt. >> normal: ?Lab Results  ?Component Value Date  ? TSH 2.02 04/01/2021  ?- at today's visit, she still complains of fatigue and weight gain.  We discussed that her TSH was excellent at last check and it does not appear that the symptoms are related to her thyroid condition.  She also has RA and Sjogren's syndrome and is on methotrexate, which may contribute to her symptoms.  At this visit, she was able to lose 40 pounds on a high-protein diet and hCG injections.  She does feel better, but still fatigued.  She discussed with her rheumatologist and was told that her fatigue is most likely  associated with her RA.   ?- we discussed that after such a significant weight loss, we may need to adjust her Armour dose down ?- we discussed about taking the thyroid hormone every day, with water, >30 minutes before breakfast,

## 2022-04-08 NOTE — Patient Instructions (Signed)
Please stop at the lab. ? ?Please continue Armour 60 mg alternating with 90 mg every other day. ? ?Take the thyroid hormone every day, with water, at least 30 minutes before breakfast, separated by at least 4 hours from: ?- acid reflux medications ?- calcium ?- iron ?- multivitamins ? ?Please come back for a follow-up appointment in 1 year. ? ?

## 2022-04-08 NOTE — Progress Notes (Signed)
Hypo   

## 2022-04-13 ENCOUNTER — Other Ambulatory Visit: Payer: Self-pay

## 2022-04-13 DIAGNOSIS — E890 Postprocedural hypothyroidism: Secondary | ICD-10-CM

## 2022-04-13 NOTE — Addendum Note (Signed)
Addended by: Kaylyn Lim I on: 04/13/2022 04:45 PM ? ? Modules accepted: Orders ? ?

## 2022-04-23 LAB — TSH: TSH: 6.73 u[IU]/mL — ABNORMAL HIGH (ref 0.450–4.500)

## 2022-04-23 LAB — T4, FREE: Free T4: 0.8 ng/dL — ABNORMAL LOW (ref 0.82–1.77)

## 2022-05-13 ENCOUNTER — Other Ambulatory Visit: Payer: Self-pay

## 2022-05-13 DIAGNOSIS — E890 Postprocedural hypothyroidism: Secondary | ICD-10-CM

## 2022-05-28 LAB — TSH: TSH: 10.3 u[IU]/mL — ABNORMAL HIGH (ref 0.450–4.500)

## 2022-05-28 LAB — T4, FREE: Free T4: 0.93 ng/dL (ref 0.82–1.77)

## 2022-05-30 ENCOUNTER — Other Ambulatory Visit: Payer: Self-pay | Admitting: Internal Medicine

## 2022-05-30 DIAGNOSIS — E890 Postprocedural hypothyroidism: Secondary | ICD-10-CM

## 2022-05-30 MED ORDER — ARMOUR THYROID 90 MG PO TABS: ORAL_TABLET | ORAL | 3 refills | Status: DC

## 2022-06-30 ENCOUNTER — Encounter: Payer: Self-pay | Admitting: Internal Medicine

## 2022-07-20 ENCOUNTER — Other Ambulatory Visit: Payer: Self-pay | Admitting: Internal Medicine

## 2022-07-20 DIAGNOSIS — E890 Postprocedural hypothyroidism: Secondary | ICD-10-CM

## 2022-07-21 LAB — TSH: TSH: 0.299 u[IU]/mL — ABNORMAL LOW (ref 0.450–4.500)

## 2022-07-21 LAB — T4, FREE: Free T4: 1.04 ng/dL (ref 0.82–1.77)

## 2022-07-21 MED ORDER — ARMOUR THYROID 15 MG PO TABS: 15.0000 mg | ORAL_TABLET | Freq: Every day | ORAL | 3 refills | Status: DC

## 2022-07-21 MED ORDER — ARMOUR THYROID 60 MG PO TABS: 60.0000 mg | ORAL_TABLET | Freq: Every day | ORAL | 3 refills | Status: DC

## 2022-07-21 NOTE — Addendum Note (Signed)
Addended by: Philemon Kingdom on: 07/21/2022 12:07 PM   Modules accepted: Orders

## 2022-09-04 ENCOUNTER — Encounter: Payer: Self-pay | Admitting: Internal Medicine

## 2022-09-05 ENCOUNTER — Other Ambulatory Visit: Payer: Self-pay

## 2022-09-05 DIAGNOSIS — E890 Postprocedural hypothyroidism: Secondary | ICD-10-CM

## 2022-09-05 NOTE — Addendum Note (Signed)
Addended by: Kaylyn Lim I on: 09/05/2022 08:48 AM   Modules accepted: Orders

## 2022-09-09 LAB — T4, FREE: Free T4: 0.99 ng/dL (ref 0.82–1.77)

## 2022-09-09 LAB — TSH: TSH: 1.23 u[IU]/mL (ref 0.450–4.500)

## 2022-09-09 LAB — T3, FREE: T3, Free: 3.3 pg/mL (ref 2.0–4.4)

## 2023-01-07 ENCOUNTER — Other Ambulatory Visit: Payer: Self-pay | Admitting: Internal Medicine

## 2023-04-13 ENCOUNTER — Ambulatory Visit: Payer: 59 | Admitting: Internal Medicine
